# Patient Record
Sex: Male | Born: 2000 | Race: Black or African American | Hispanic: No | Marital: Single | Smoking: Never smoker
Health system: Southern US, Community
[De-identification: ages and names within clinical notes are randomized; demographics above are authoritative.]

## PROBLEM LIST (undated history)

## (undated) ENCOUNTER — Emergency Department (HOSPITAL_BASED_OUTPATIENT_CLINIC_OR_DEPARTMENT_OTHER): Admission: EM | Payer: 59 | Source: Home / Self Care

## (undated) DIAGNOSIS — F411 Generalized anxiety disorder: Secondary | ICD-10-CM

## (undated) DIAGNOSIS — F339 Major depressive disorder, recurrent, unspecified: Secondary | ICD-10-CM

## (undated) DIAGNOSIS — J45909 Unspecified asthma, uncomplicated: Secondary | ICD-10-CM

## (undated) HISTORY — DX: Major depressive disorder, recurrent, unspecified: F33.9

## (undated) HISTORY — DX: Generalized anxiety disorder: F41.1

---
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
## 2022-01-01 DIAGNOSIS — F332 Major depressive disorder, recurrent severe without psychotic features: Secondary | ICD-10-CM

## 2022-01-01 DIAGNOSIS — R45851 Suicidal ideations: Secondary | ICD-10-CM

## 2022-01-01 HISTORY — DX: Major depressive disorder, recurrent severe without psychotic features: F33.2

## 2022-01-01 LAB — RAPID URINE DRUG SCREEN, HOSP PERFORMED
Amphetamines: NOT DETECTED
Barbiturates: NOT DETECTED
Benzodiazepines: NOT DETECTED
Cocaine: NOT DETECTED
Opiates: NOT DETECTED
Tetrahydrocannabinol: NOT DETECTED

## 2022-01-01 LAB — RESP PANEL BY RT-PCR (FLU A&B, COVID) ARPGX2
Influenza A by PCR: NEGATIVE
Influenza B by PCR: NEGATIVE
SARS Coronavirus 2 by RT PCR: NEGATIVE

## 2022-01-01 MED ORDER — HYDROXYZINE HCL 50 MG PO TABS
50.0000 mg | ORAL_TABLET | Freq: Every evening | ORAL | Status: DC | PRN
  Administered 2022-01-01: 50 mg via ORAL
  Filled 2022-01-01: qty 1

## 2022-01-01 MED ORDER — ARIPIPRAZOLE 2 MG PO TABS
2.0000 mg | ORAL_TABLET | Freq: Every day | ORAL | Status: DC
  Administered 2022-01-01 – 2022-01-02 (×2): 2 mg via ORAL
  Filled 2022-01-01 (×3): qty 1

## 2022-01-01 MED ORDER — VENLAFAXINE HCL ER 75 MG PO CP24
75.0000 mg | ORAL_CAPSULE | Freq: Every day | ORAL | Status: DC
  Administered 2022-01-02: 75 mg via ORAL
  Filled 2022-01-01: qty 1

## 2022-01-01 NOTE — Progress Notes (Signed)
Per [name], NP, patient meets criteria for inpatient treatment. There are no available beds at [facility] today. CSW faxed referrals to the following facilities for review:  [facility]  Pending - No Request Sent N/A [address], [place] [address] [phone] --  [facility]  Pending - No Request Sent N/A [address] [place] [address] [phone] --  [facility]  Pending - No Request Sent N/A [address], [place] [address] [phone] --  [facility]  Pending - No Request Sent N/A [address] [place] [address] [phone] --  [facility]  Pending - No Request Sent N/A [address] [place] [address] [phone] --  [facility]  Pending - No Request Sent N/A [address] [place] [address] [phone] --   TTS will continue to seek bed placement.  Crissie Reese, MSW, Lenice Pressman Phone: [phone] Disposition/TOC

## 2022-01-01 NOTE — ED Notes (Signed)
Belongings placed in locker # 7

## 2022-01-01 NOTE — ED Notes (Signed)
Visitor at bedside.

## 2022-01-01 NOTE — ED Notes (Signed)
Pt belongings located in locker #7: shirt, pants, shoes, key chain, phone charger x 2, headset. Valuables w/ security: cell phone, 1 $20 bill, debit card, wallet. Pt in scrubs upon arrival to [place]. Beaded bracelet removed from pt and placed in belongings bag. Sitter at bedside

## 2022-01-01 NOTE — Consult Note (Signed)
BH ED ASSESSMENT   Reason for Consult:  SI Referring Physician:  Lula Olszewski, [place] Patient Identification: Ian Li MRN:  [id number] ED Chief Complaint: Suicidal ideation  Diagnosis:  Principal Problem:   Suicidal ideation Active Problems:   Major depressive disorder, recurrent episode, severe (HCC)   ED Assessment Time Calculation: Start Time: 1200 Stop Time: 1230 Total Time in Minutes (Assessment Completion): 30   Subjective:   Ian Li is a 21 y.o. male patient who presented to [name], ED reporting increased depressive symptoms and suicidal ideations with plan.  He states 5 days ago his partner ended their 2-1/2-year relationship and he now wants to kill himself by shooting himself, wrecking his car, or overdosing on pills.  HPI:   Patient seen in his room at [facility] ED for face-to-face evaluation.  He is pleasant and cooperative with assessment.  He tells me he has a lot of current stressors going on.  He started college at [employer] and reports that is not going well.  He lives with 3 roommates and states he does not get along with them.  His partner of 2-1/2 years also broke up with him and he is experiencing a lot of grief.  He tells me has a history of depression and has attempted suicide 1 time before.  It was around 2-1/2 years ago where he attempted to overdose.  He did not go to the hospital and did not get psychiatric treatment after this attempt.  He tells me he told no one about this attempt.  Today he continues to endorse suicidal ideations however denies having a specific plan or intent.  He states he just feels very depressed and has no will to live at the moment.  He denies any homicidal ideations.  Denies any auditory or visual hallucinations.  He says his sleep and appetite has fluctuated.  His appetite more so recently has been decreased especially since the break-up.  He does see a psychiatrist for med management.  He is currently taking Pristiq, hydroxyzine, and  Abilify.  He reports many failed psychotropic trials with Zoloft, Lexapro, and Prozac.  He also sees a therapist once every 2 weeks.  He feels like his family is a good support system specifically his brother.  However he does feel like he does not have any friends right now.  Patient is unable to contract for safety at this time, he is agreeable to inpatient psychiatric treatment.  Patient is able to engage in logical and coherent conversation.  Speech is normal in rate and tone.  No evidence that he is responding to internal stimuli, does not appear psychotic at this time.  He does have depressed mood and affect.  He is not able to identify many reasons for living at this time.  He does not have anyone he wants me to contact at this time.  We will continue to recommend inpatient psychiatric treatment.  EDP, RN, and LCSW notified of disposition.  Past Psychiatric History:  Reported history of depression and anxiety.  1 previous suicide attempt around 2 years ago.  Risk to Self or Others: Is the patient at risk to self? Yes Has the patient been a risk to self in the past 6 months? No Has the patient been a risk to self within the distant past? Yes Is the patient a risk to others? No Has the patient been a risk to others in the past 6 months? No Has the patient been a risk to others within the  distant past? No  [place] Scale:  Flowsheet Row ED from 12/31/2021 in [facility] EMERGENCY DEPARTMENT  C-SSRS RISK CATEGORY Moderate Risk       Substance Abuse:  Alcohol / Drug Use Pain Medications: Denies abuse Prescriptions: Denies abuse Over the Counter: Denies abuse History of alcohol / drug use?: No history of alcohol / drug abuse Longest period of sobriety (when/how long): NA  Past Medical History:  Past Medical History:  Diagnosis Date   Asthma    History reviewed. No pertinent surgical history. Family History: No family history on file.  Social History:  Social  History   Substance and Sexual Activity  Alcohol Use Never     Social History   Substance and Sexual Activity  Drug Use Never    Social History   Socioeconomic History   Marital status: Single    Spouse name: Not on file   Number of children: Not on file   Years of education: Not on file   Highest education level: Not on file  Occupational History   Not on file  Tobacco Use   Smoking status: Never   Smokeless tobacco: Never  Vaping Use   Vaping Use: Never used  Substance and Sexual Activity   Alcohol use: Never   Drug use: Never   Sexual activity: Not on file  Other Topics Concern   Not on file  Social History Narrative   Not on file   Social Determinants of Health   Financial Resource Strain: Not on file  Food Insecurity: Not on file  Transportation Needs: Not on file  Physical Activity: Not on file  Stress: Not on file  Social Connections: Not on file   Additional Social History:    Allergies:  No Known Allergies  Labs:  Results for orders placed or performed during the hospital encounter of 12/31/21 (from the past 48 hour(s))  Ethanol     Status: None   Collection Time: 12/31/21  8:30 PM  Result Value Ref Range   Alcohol, Ethyl (B) <10 <10 mg/dL    Comment: (NOTE) Lowest detectable limit for serum alcohol is 10 mg/dL.  For medical purposes only. Performed at [facility], [address], [place], [place] [address]   Acetaminophen level     Status: Abnormal   Collection Time: 12/31/21  8:30 PM  Result Value Ref Range   Acetaminophen (Tylenol), Serum <10 (L) 10 - 30 ug/mL    Comment: (NOTE) Therapeutic concentrations vary significantly. A range of 10-30 ug/mL  may be an effective concentration for many patients. However, some  are best treated at concentrations outside of this range. Acetaminophen concentrations >150 ug/mL at 4 hours after ingestion  and >50 ug/mL at 12 hours after ingestion are often associated with  toxic  reactions.  Performed at [facility], [address] [place], [place] [address]   Salicylate level     Status: Abnormal   Collection Time: 12/31/21  8:30 PM  Result Value Ref Range   Salicylate Lvl <7.0 (L) 7.0 - 30.0 mg/dL    Comment: Performed at [facility], [address], [place], [place] [address]  CBC with Differential/Platelet     Status: None   Collection Time: 12/31/21  8:30 PM  Result Value Ref Range   WBC 8.5 4.0 - 10.5 K/uL   RBC 4.69 4.22 - 5.81 MIL/uL   Hemoglobin 13.7 13.0 - 17.0 g/dL   HCT 76.5 46.5 - 03.5 %  MCV 89.3 80.0 - 100.0 fL   MCH 29.2 26.0 - 34.0 pg   MCHC 32.7 30.0 - 36.0 g/dL   RDW 29.9 37.1 - 69.6 %   Platelets 300 150 - 400 K/uL   nRBC 0.0 0.0 - 0.2 %   Neutrophils Relative % 61 %   Neutro Abs 5.2 1.7 - 7.7 K/uL   Lymphocytes Relative 29 %   Lymphs Abs 2.5 0.7 - 4.0 K/uL   Monocytes Relative 8 %   Monocytes Absolute 0.7 0.1 - 1.0 K/uL   Eosinophils Relative 2 %   Eosinophils Absolute 0.1 0.0 - 0.5 K/uL   Basophils Relative 0 %   Basophils Absolute 0.0 0.0 - 0.1 K/uL   Immature Granulocytes 0 %   Abs Immature Granulocytes 0.02 0.00 - 0.07 K/uL    Comment: Performed at [facility], [address] [place], [place] [address]  Comprehensive metabolic panel     Status: None   Collection Time: 12/31/21  8:30 PM  Result Value Ref Range   Sodium 142 135 - 145 mmol/L   Potassium 3.8 3.5 - 5.1 mmol/L   Chloride 105 98 - 111 mmol/L   CO2 25 22 - 32 mmol/L   Glucose, Bld 90 70 - 99 mg/dL    Comment: Glucose reference range applies only to samples taken after fasting for at least 8 hours.   BUN 8 6 - 20 mg/dL   Creatinine, Ser 1.01 0.61 - 1.24 mg/dL   Calcium 9.7 8.9 - 75.1 mg/dL   Total Protein 7.0 6.5 - 8.1 g/dL   Albumin 4.2 3.5 - 5.0 g/dL   AST 19 15 - 41 U/L   ALT 14 0 - 44 U/L   Alkaline Phosphatase 72 38 - 126 U/L   Total Bilirubin 0.4 0.3 - 1.2 mg/dL   GFR, Estimated >02 >58 mL/min    Comment: (NOTE) Calculated  using the CKD-EPI Creatinine Equation (2021)    Anion gap 12 5 - 15    Comment: Performed at [facility], [address], [place], [place] [address]  Resp Panel by RT-PCR (Flu A&B, Covid) Anterior Nasal Swab     Status: None   Collection Time: 01/01/22  4:21 AM   Specimen: Anterior Nasal Swab  Result Value Ref Range   SARS Coronavirus 2 by RT PCR NEGATIVE NEGATIVE    Comment: (NOTE) SARS-CoV-2 target nucleic acids are NOT DETECTED.  The SARS-CoV-2 RNA is generally detectable in upper respiratory specimens during the acute phase of infection. The lowest concentration of SARS-CoV-2 viral copies this assay can detect is 138 copies/mL. A negative result does not preclude SARS-Cov-2 infection and should not be used as the sole basis for treatment or other patient management decisions. A negative result may occur with  improper specimen collection/handling, submission of specimen other than nasopharyngeal swab, presence of viral mutation(s) within the areas targeted by this assay, and inadequate number of viral copies(<138 copies/mL). A negative result must be combined with clinical observations, patient history, and epidemiological information. The expected result is Negative.  Fact Sheet for Patients:  BloggerCourse.com  Fact Sheet for Healthcare Providers:  SeriousBroker.it  This test is no t yet approved or cleared by the [place] FDA and  has been authorized for detection and/or diagnosis of SARS-CoV-2 by FDA under an Emergency Use Authorization (EUA). This EUA will remain  in effect (meaning this test can be used) for the duration of the COVID-19 declaration under Section 564(b)(1) of  the Act, 21 U.S.C.section 360bbb-3(b)(1), unless the authorization is terminated  or revoked sooner.       Influenza A by PCR NEGATIVE NEGATIVE   Influenza B by PCR NEGATIVE NEGATIVE    Comment: (NOTE) The Xpert Xpress  SARS-CoV-2/FLU/RSV plus assay is intended as an aid in the diagnosis of influenza from Nasopharyngeal swab specimens and should not be used as a sole basis for treatment. Nasal washings and aspirates are unacceptable for Xpert Xpress SARS-CoV-2/FLU/RSV testing.  Fact Sheet for Patients: BloggerCourse.comhttps://www.fda.gov/media/152166/download  Fact Sheet for Healthcare Providers: SeriousBroker.ithttps://www.fda.gov/media/152162/download  This test is not yet approved or cleared by the [place] FDA and has been authorized for detection and/or diagnosis of SARS-CoV-2 by FDA under an Emergency Use Authorization (EUA). This EUA will remain in effect (meaning this test can be used) for the duration of the COVID-19 declaration under Section 564(b)(1) of the Act, 21 U.S.C. section 360bbb-3(b)(1), unless the authorization is terminated or revoked.  Performed at [facility], [address] [place], [place] [address]     No current facility-administered medications for this encounter.   Current Outpatient Medications  Medication Sig Dispense Refill   ARIPiprazole (ABILIFY) 2 MG tablet Take 2 mg by mouth daily.     desvenlafaxine (PRISTIQ) 50 MG 24 hr tablet Take 50 mg by mouth daily.     hydrOXYzine (ATARAX) 50 MG tablet Take 50 mg by mouth at bedtime as needed.     naproxen (NAPROSYN) 500 MG tablet Take 500 mg by mouth 2 (two) times daily.     ibuprofen (ADVIL) 800 MG tablet Take 1 tablet (800 mg total) by mouth every 6 (six) hours as needed for moderate pain. (Patient not taking: Reported on 12/31/2021) 20 tablet 0   Psychiatric Specialty Exam: Presentation  General Appearance: Appropriate for Environment  Eye Contact:Good  Speech:Clear and Coherent  Speech Volume:Normal  Handedness:No data recorded  Mood and Affect  Mood:Depressed  Affect:Congruent   Thought Process  Thought Processes:Coherent  Descriptions of Associations:Intact  Orientation:Full (Time, Place and Person)  Thought  Content:Logical  History of Schizophrenia/Schizoaffective disorder:No  Duration of Psychotic Symptoms:No data recorded Hallucinations:Hallucinations: None  Ideas of Reference:None  Suicidal Thoughts:Suicidal Thoughts: Yes, Passive SI Passive Intent and/or Plan: Without Intent; Without Plan  Homicidal Thoughts:Homicidal Thoughts: No   Sensorium  Memory:Immediate Good; Recent Good  Judgment:Fair  Insight:Fair   Executive Functions  Concentration:Good  Attention Span:Good  Recall:Good  Fund of Knowledge:Good  Language:Good   Psychomotor Activity  Psychomotor Activity:Psychomotor Activity: Normal   Assets  Assets:Communication Skills; Physical Health; Resilience; Desire for Improvement; Social Support    Sleep  Sleep:Sleep: Fair   Physical Exam: Physical Exam Neurological:     Mental Status: He is alert and oriented to person, place, and time.    Review of Systems  Psychiatric/Behavioral:  Positive for depression and suicidal ideas. The patient has insomnia.   All other systems reviewed and are negative.  Blood pressure 118/77, pulse (!) 59, temperature 98.2 F (36.8 C), temperature source Oral, resp. rate 14, height 6' (1.829 m), weight 99.8 kg, SpO2 99 %. Body mass index is 29.84 kg/m.  Medical Decision Making: Patient case reviewed and discussed with Dr. Gasper Sellsinderella.  We will continue to recommend inpatient psychiatric treatment.  Patient continues to endorse SI, is not able to contract for safety.  [facility], CSW notified and patient will be faxed out.  EDP, RN, and LCSW notified of disposition.  -Continue home medications  Disposition:  Recommend psychiatric Inpatient admission when medically cleared.  Eligha Bridegroom, NP 01/01/2022 1:19 PM

## 2022-01-01 NOTE — ED Provider Notes (Addendum)
Emergency Medicine Observation Re-evaluation Note  Ian Li is a 21 y.o. male, seen on rounds today.  Pt initially presented to the ED for complaints of feels unsafe Currently, the patient is alert, watching TV.  Physical Exam  BP 118/77 (BP Location: Right Arm)   Pulse (!) 59   Temp 98.2 F (36.8 C) (Oral)   Resp 14   Ht 6' (1.829 m)   Wt 99.8 kg   SpO2 99%   BMI 29.84 kg/m  Physical Exam General: NAD Cardiac: Well perfused Lungs: even and unlabored Psych: No agitation  ED Course / MDM  EKG:EKG Interpretation  Date/Time:  Saturday December 31 2021 20:44:28 EDT Ventricular Rate:  64 PR Interval:  138 QRS Duration: 100 QT Interval:  392 QTC Calculation: 404 R Axis:   69 Text Interpretation: Normal sinus rhythm with sinus arrhythmia Normal ECG Confirmed by [name] ([id number]) on 01/01/2022 12:14:39 AM  I have reviewed the labs performed to date as well as medications administered while in observation.  Recent changes in the last 24 hours include evaluated by psychiatry, meets criteria for inpatient treatment.  Plan  Current plan is for Inpatient, social work coordinating.  Update: Psychiatry recs commended that the patient be IVC it as he is no longer willing to stay voluntarily.  He had endorsed suicidal ideation earlier today and has a history of suicide attempts.  IVC paperwork and first exam filled out and faxed. Home meds ordered.    Ernie Avena, MD 01/01/22 [id number]    Ernie Avena, MD 01/01/22 [id number]    Ernie Avena, MD 01/01/22 [id number]

## 2022-01-01 NOTE — ED Notes (Signed)
Patient is making phone call at RN station

## 2022-01-01 NOTE — ED Notes (Signed)
Pt resting with eyes closed; respirations spontaneous, even, unlabored 
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
## 2022-01-01 NOTE — ED Notes (Signed)
Pt's mother updated on pt status and about visiting hours.

## 2022-01-01 NOTE — ED Notes (Signed)
TTS in progress 

## 2022-01-01 NOTE — ED Notes (Signed)
Mother called, informed her that the doctors had recommended inpatient treatment.

## 2022-01-02 ENCOUNTER — Encounter (HOSPITAL_COMMUNITY): Payer: Self-pay | Admitting: Nurse Practitioner

## 2022-01-02 ENCOUNTER — Inpatient Hospital Stay (HOSPITAL_COMMUNITY)
Admission: AD | Admit: 2022-01-02 | Discharge: 2022-01-07 | DRG: 885 | Disposition: A | Discharge status: Home / Self Care | Payer: 59 | Source: Intra-hospital | Attending: Emergency Medicine | Admitting: Emergency Medicine

## 2022-01-02 ENCOUNTER — Other Ambulatory Visit: Payer: Self-pay

## 2022-01-02 DIAGNOSIS — R45851 Suicidal ideations: Secondary | ICD-10-CM | POA: Diagnosis present

## 2022-01-02 DIAGNOSIS — Z9151 Personal history of suicidal behavior: Secondary | ICD-10-CM

## 2022-01-02 DIAGNOSIS — Z91410 Personal history of adult physical and sexual abuse: Secondary | ICD-10-CM

## 2022-01-02 DIAGNOSIS — E781 Pure hyperglyceridemia: Secondary | ICD-10-CM | POA: Diagnosis present

## 2022-01-02 DIAGNOSIS — Z20822 Contact with and (suspected) exposure to covid-19: Secondary | ICD-10-CM | POA: Diagnosis present

## 2022-01-02 DIAGNOSIS — F419 Anxiety disorder, unspecified: Secondary | ICD-10-CM

## 2022-01-02 DIAGNOSIS — R634 Abnormal weight loss: Secondary | ICD-10-CM | POA: Diagnosis present

## 2022-01-02 DIAGNOSIS — F332 Major depressive disorder, recurrent severe without psychotic features: Principal | ICD-10-CM | POA: Diagnosis present

## 2022-01-02 DIAGNOSIS — Z818 Family history of other mental and behavioral disorders: Secondary | ICD-10-CM

## 2022-01-02 DIAGNOSIS — F4310 Post-traumatic stress disorder, unspecified: Secondary | ICD-10-CM | POA: Diagnosis present

## 2022-01-02 DIAGNOSIS — F329 Major depressive disorder, single episode, unspecified: Secondary | ICD-10-CM | POA: Diagnosis present

## 2022-01-02 DIAGNOSIS — G4700 Insomnia, unspecified: Secondary | ICD-10-CM | POA: Diagnosis present

## 2022-01-02 DIAGNOSIS — J45909 Unspecified asthma, uncomplicated: Secondary | ICD-10-CM | POA: Diagnosis present

## 2022-01-02 MED ORDER — ACETAMINOPHEN 325 MG PO TABS
650.0000 mg | ORAL_TABLET | Freq: Four times a day (QID) | ORAL | Status: DC | PRN
  Administered 2022-01-05: 650 mg via ORAL
  Filled 2022-01-02: qty 2

## 2022-01-02 MED ORDER — VENLAFAXINE HCL ER 75 MG PO CP24
75.0000 mg | ORAL_CAPSULE | Freq: Every day | ORAL | Status: DC
Start: 2022-01-03 — End: 2022-01-03
  Administered 2022-01-03: 75 mg via ORAL
  Filled 2022-01-02 (×3): qty 1

## 2022-01-02 MED ORDER — ALUM & MAG HYDROXIDE-SIMETH 200-200-20 MG/5ML PO SUSP: 30.0000 mL | ORAL | Status: DC | PRN

## 2022-01-02 MED ORDER — ARIPIPRAZOLE 2 MG PO TABS
2.0000 mg | ORAL_TABLET | Freq: Every day | ORAL | Status: DC
Start: 2022-01-03 — End: 2022-01-03
  Administered 2022-01-03: 2 mg via ORAL
  Filled 2022-01-02 (×3): qty 1

## 2022-01-02 MED ORDER — ENSURE ENLIVE PO LIQD
237.0000 mL | Freq: Two times a day (BID) | ORAL | Status: DC
  Administered 2022-01-03: 237 mL via ORAL
  Filled 2022-01-02 (×11): qty 237

## 2022-01-02 MED ORDER — MAGNESIUM HYDROXIDE 400 MG/5ML PO SUSP: 30.0000 mL | Freq: Every day | ORAL | Status: DC | PRN

## 2022-01-02 MED ORDER — HYDROXYZINE HCL 50 MG PO TABS
50.0000 mg | ORAL_TABLET | Freq: Every evening | ORAL | Status: DC | PRN
  Administered 2022-01-02: 50 mg via ORAL
  Filled 2022-01-02: qty 1

## 2022-01-02 NOTE — ED Notes (Signed)
Mother provided with hand out on visiting hours.

## 2022-01-02 NOTE — ED Notes (Signed)
Pt's Mother requested to bring out side food to PT . Mother informed Pt unable to receive out side food .

## 2022-01-02 NOTE — ED Notes (Signed)
PT Mother Okey Regal at bed side and requested Pt belongings to take home. Mother signed forms from belongings including items locked in SAFE.

## 2022-01-02 NOTE — Progress Notes (Signed)
Patient is an 21 year old male who presented under IVC from [facility] for increased depression and SI. Pt reported his stressors as school, and a recent break up with a girlfriend. Pt denied history of alcohol or substance use. Pt currently denies SI/HI and A/VH.  Patient presented friendly, polite, was calm and cooperative and answered questions logically and coherently during admission interview and assessment. VS monitored and recorded. Skin check performed with MHT. Patient did not have any belongings, other than a paperback book  Admission paperwork completed and signed. Verbal understanding expressed. Pt was oriented to unit and schedule. Q 15 min checks initiated for safety.Marland Kitchen

## 2022-01-02 NOTE — ED Notes (Signed)
Pt mother returned for 1230 visit. Mother updated on Pt admission to [facility] after 1600 today.Mother given the address and telephone # to [place]

## 2022-01-02 NOTE — ED Provider Notes (Addendum)
Emergency Medicine Observation Re-evaluation Note  Ian Li is a 21 y.o. male, seen on rounds today.  Pt initially presented to the ED for complaints of Suicidal (/) Currently, the patient is sleeping.  Physical Exam  BP (!) 88/60 (BP Location: Right Arm)   Pulse 60   Temp 98.4 F (36.9 C) (Oral)   Resp 18   Ht 1.829 m (6')   Wt 99.8 kg   SpO2 96%   BMI 29.84 kg/m  Physical Exam General: Resting Cardiac: Regular rate Lungs: Normal respiratory effort Psych: Deferred  ED Course / MDM  EKG:EKG Interpretation  Date/Time:  Saturday December 31 2021 20:44:28 EDT Ventricular Rate:  64 PR Interval:  138 QRS Duration: 100 QT Interval:  392 QTC Calculation: 404 R Axis:   69 Text Interpretation: Normal sinus rhythm with sinus arrhythmia Normal ECG Confirmed by [name] ([id number]) on 01/01/2022 12:14:39 AM  I have reviewed the labs performed to date as well as medications administered while in observation.  Recent changes in the last 24 hours include evaluation by psychiatry yesterday.  Recommendation is for inpatient treatment.  Initial blood pressure this morning decreased at 88/60.  Repeat blood pressure is 121/71.  Doubt first BP was accurate  Plan  Current plan is for inpatient psychiatric treatment.    Linwood Dibbles, MD 01/02/22 [phone] Plan is to transfer to behavioral health.  Dr. Mason Jim is the accepting physician   Linwood Dibbles, MD 01/02/22 1137

## 2022-01-02 NOTE — Progress Notes (Signed)
Pt was accepted to [facility] 01/02/22; Bed Assignment 306-1 at 1600  Dx: MDD.   Pt meets inpatient criteria per Eligha Bridegroom, NP  Attending Physician will be Mason Jim, MD  Report can be called to: - Child and Adolescence unit: [phone] -Adult unit: [phone]  Pt can arrive after 1600  Please fax IVC paperwork to [phone].   Care Team notified: [facility] Rona Ravens, RN, Eligha Bridegroom, NP, Celine Ahr, RN, Iantha Fallen, MD, [address] [place], LCSWA 01/02/2022 @ 11:44 AM

## 2022-01-02 NOTE — Progress Notes (Signed)
Patient did attend the evening speaker AA meeting.Pt introduced himself as a non-alcoholic joining the meeting. Pt was presented anxious and tearful during meeting but was supportive.

## 2022-01-02 NOTE — ED Notes (Signed)
Mother informed Pt will need to call Mother when he is transferred to inpatient.

## 2022-01-02 NOTE — ED Notes (Signed)
IVC paper work faxed to [phone]

## 2022-01-02 NOTE — Tx Team (Signed)
Initial Treatment Plan 01/02/2022 8:48 PM Murdock Jellison VOP:[id number]    PATIENT STRESSORS: Educational concerns   Loss of relationship     PATIENT STRENGTHS: Ability for insight  Average or above average intelligence  Capable of independent living  Communication skills  Motivation for treatment/growth  Physical Health  Supportive family/friends    PATIENT IDENTIFIED PROBLEMS: Suicidal ideation    Increased depression    Worrying             DISCHARGE CRITERIA:  Improved stabilization in mood, thinking, and/or behavior Need for constant or close observation no longer present Reduction of life-threatening or endangering symptoms to within safe limits Verbal commitment to aftercare and medication compliance  PRELIMINARY DISCHARGE PLAN: Outpatient therapy Return to previous work or school arrangements  PATIENT/FAMILY INVOLVEMENT: This treatment plan has been presented to and reviewed with the patient, Ian Li,  The patient has been given the opportunity to ask questions and make suggestions.  Shela Nevin, RN 01/02/2022, 8:48 PM

## 2022-01-03 DIAGNOSIS — F419 Anxiety disorder, unspecified: Secondary | ICD-10-CM

## 2022-01-03 HISTORY — DX: Anxiety disorder, unspecified: F41.9

## 2022-01-03 LAB — HEMOGLOBIN A1C
Hgb A1c MFr Bld: 5.4 % (ref 4.8–5.6)
Mean Plasma Glucose: 108.28 mg/dL

## 2022-01-03 LAB — TSH: TSH: 0.514 u[IU]/mL (ref 0.350–4.500)

## 2022-01-03 MED ORDER — BUPROPION HCL ER (XL) 150 MG PO TB24
150.0000 mg | ORAL_TABLET | Freq: Every day | ORAL | Status: DC
  Administered 2022-01-04 – 2022-01-05 (×2): 150 mg via ORAL
  Filled 2022-01-03 (×3): qty 1

## 2022-01-03 MED ORDER — ARIPIPRAZOLE 5 MG PO TABS
5.0000 mg | ORAL_TABLET | Freq: Every day | ORAL | Status: DC
  Administered 2022-01-04 – 2022-01-07 (×4): 5 mg via ORAL
  Filled 2022-01-03 (×5): qty 1

## 2022-01-03 MED ORDER — HYDROXYZINE HCL 25 MG PO TABS
25.0000 mg | ORAL_TABLET | Freq: Three times a day (TID) | ORAL | Status: DC | PRN
  Administered 2022-01-04 – 2022-01-06 (×3): 25 mg via ORAL
  Filled 2022-01-03 (×3): qty 1

## 2022-01-03 MED ORDER — TRAZODONE HCL 50 MG PO TABS
50.0000 mg | ORAL_TABLET | Freq: Every evening | ORAL | Status: DC | PRN
Start: 2022-01-03 — End: 2022-01-07
  Administered 2022-01-03 – 2022-01-06 (×4): 50 mg via ORAL
  Filled 2022-01-03 (×4): qty 1

## 2022-01-03 NOTE — Progress Notes (Signed)
   01/02/22 2055  Psych Admission Type (Psych Patients Only)  Admission Status Involuntary  Psychosocial Assessment  Patient Complaints None  Eye Contact Brief  Facial Expression Anxious  Affect Appropriate to circumstance  Speech Logical/coherent  Interaction Assertive  Motor Activity Slow  Appearance/Hygiene In scrubs  Behavior Characteristics Cooperative;Appropriate to situation  Mood Anxious  Thought Process  Coherency WDL  Content WDL  Delusions None reported or observed  Perception WDL  Hallucination None reported or observed  Judgment Poor  Confusion None  Danger to Self  Current suicidal ideation? Denies  Agreement Not to Harm Self Yes  Description of Agreement verbal  Danger to Others  Danger to Others None reported or observed   Progress note   D: Pt seen at nurse's station. Pt denies SI, HI, AVH. Pt rates pain  0/10. Pt rates anxiety  0/10 and depression  0/10. Pt states that he has had outpatient behavioral health but not inpatient. Pt questions answered about unit milieu. Discussed pt medications. Pt states that he doesn't want the nutritional supplements ordered because he is weight conscious. Says he hadn't eaten for about a week but is not starting to eat more. Pt started on Effexor in ED before coming to [facility]. Pt denies any side effects. No other concerns noted at this time.  A: Pt provided support and encouragement. Pt given scheduled medication as prescribed. PRNs as appropriate. Q15 min checks for safety.   R: Pt safe on the unit. Will continue to monitor.
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
## 2022-01-03 NOTE — Progress Notes (Signed)
The patient's positive event for the day is that he had a good visit with his brother. He also talked to a friend that he made on the unit. He rates his day as a 7 out of 10 since today was his first day in the hospital.

## 2022-01-03 NOTE — Progress Notes (Signed)
   01/03/22 0800  Psych Admission Type (Psych Patients Only)  Admission Status Involuntary  Psychosocial Assessment  Patient Complaints None  Eye Contact Brief  Facial Expression Anxious  Affect Appropriate to circumstance  Speech Logical/coherent  Interaction Cautious  Motor Activity Slow  Appearance/Hygiene In scrubs  Behavior Characteristics Cooperative;Appropriate to situation  Mood Anxious  Thought Process  Coherency WDL  Content WDL  Delusions None reported or observed  Perception WDL  Hallucination None reported or observed  Judgment Poor  Confusion None  Danger to Self  Current suicidal ideation? Denies  Agreement Not to Harm Self Yes  Description of Agreement Verbal  Danger to Others  Danger to Others None reported or observed

## 2022-01-03 NOTE — Group Note (Signed)
Recreation Therapy Group Note   Group Topic:Animal Assisted Therapy   Group Date: 01/03/2022 Start Time: 1430 End Time: 1515 Facilitators: Caroll Rancher, LRT,CTRS Location: [address] Dayroom   Animal-Assisted Activity (AAA) Program Checklist/Progress Note Patient Eligibility Criteria Checklist & Daily Group note for Rec Tx Intervention   AAA/T Program Assumption of Risk Form signed by Patient/ or Parent Legal Guardian YES  Patient understands their participation is voluntary YES   Affect/Mood: N/A   Participation Level: Did not attend    Clinical Observations/Individualized Feedback:     Plan: Continue to engage patient in RT group sessions 2-3x/week.   Caroll Rancher, LRT,CTRS 01/03/2022 3:54 PM

## 2022-01-03 NOTE — [facility] Group Notes (Signed)
Adult Psychoeducational Group Note  Date:  01/03/2022 Time:  9:56 AM  Group Topic/Focus:  Goals Group:   The focus of this group is to help patients establish daily goals to achieve during treatment and discuss how the patient can incorporate goal setting into their daily lives to aide in recovery.  Participation Level:  Did Not Attend  Elmond Poehlman R Hannie Shoe 01/03/2022, 9:56 AM 
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
## 2022-01-03 NOTE — Hospital Course (Addendum)
Ian Li is a 21 y.o. male patient who identifies as gender-fluid with a past psychiatric history of MDD, 2 prior suicide attempts, unspecified anxiety, and PTSD who was  transferred from [name] ED involuntarily for worsening depressive symptoms for past 5 days and SI with plan triggered by a recent breakup with girlfriend in the setting of multiple longstanding stressors (see below), and failed trials on SSRIs/SNRIs.  Pristiq > Effexor  24-hour events: - 3 groups - took scheduled meds - trazodone, hydroxyzine PRN meds  Relevant labs / tests / procedures: - lipid panel high trigs - TSH 0.514 normal - A1c 5.4 normal  Plan today (01/03/2022) - venlafaxine XR (Effexor XR) discontinue  - START bupropion (Wellbutrin XL) 75 tomorrow, then 150 next day - aripiprazole from 2 mg up to 5 mg  (max 15 per day)  - TSH, A1c, lipid panel  Plan Yesterday ***  Dispo Plan: - Sunday (9/9) or Monday (9/10)

## 2022-01-04 ENCOUNTER — Encounter (HOSPITAL_COMMUNITY): Payer: Self-pay

## 2022-01-04 LAB — LIPID PANEL
Cholesterol: 165 mg/dL (ref 0–169)
HDL: 35 mg/dL — ABNORMAL LOW (ref >40)
LDL Cholesterol: 78 mg/dL (ref 0–99)
Total CHOL/HDL Ratio: 4.7 RATIO
Triglycerides: 260 mg/dL — ABNORMAL HIGH (ref <150)
VLDL: 52 mg/dL — ABNORMAL HIGH (ref 0–40)

## 2022-01-04 NOTE — Progress Notes (Signed)
   01/04/22 2100  Psych Admission Type (Psych Patients Only)  Admission Status Involuntary  Psychosocial Assessment  Patient Complaints None  Eye Contact Fair  Facial Expression Anxious  Affect Appropriate to circumstance  Speech Logical/coherent  Interaction Assertive  Motor Activity Other (Comment) (wnl)  Appearance/Hygiene In scrubs  Behavior Characteristics Cooperative;Appropriate to situation  Mood Pleasant  Thought Process  Coherency WDL  Content WDL  Delusions None reported or observed  Perception WDL  Hallucination None reported or observed  Judgment WDL  Confusion None  Danger to Self  Current suicidal ideation? Denies  Danger to Others  Danger to Others None reported or observed   Progress note   D: Pt seen at med window. Pt denies SI, HI, AVH. Pt rates pain  0/10. Pt rates anxiety  0/10 and depression  0/10. Pt says that he has not been suicidal since he first came to the hospital. "They have it wrong. I never had a plan. When I had a plan, it was earlier in the year. I try to tell them that I am better and am not suicidal and I don't think they're listening." Pt encouraged to ask provider about discharge. Pt endorses group attendance. No other complaints noted.  A: Pt provided support and encouragement. Pt given scheduled medication as prescribed. PRNs as appropriate. Q15 min checks for safety.   R: Pt safe on the unit. Will continue to monitor.

## 2022-01-04 NOTE — Progress Notes (Signed)
The patient attended the evening N.A.meeting and was appropriate.  

## 2022-01-04 NOTE — Progress Notes (Signed)
   01/04/22 0925  Psych Admission Type (Psych Patients Only)  Admission Status Involuntary  Psychosocial Assessment  Patient Complaints Anxiety;Depression  Eye Contact Brief  Facial Expression Anxious  Affect Depressed  Speech Logical/coherent  Interaction Guarded  Motor Activity Other (Comment) (wdl)  Appearance/Hygiene In scrubs  Behavior Characteristics Cooperative  Mood Depressed  Thought Process  Coherency WDL  Content WDL  Delusions None reported or observed  Perception WDL  Hallucination None reported or observed  Judgment Poor  Confusion None  Danger to Self  Current suicidal ideation? Denies  Agreement Not to Harm Self Yes  Description of Agreement verbal contract  Danger to Others  Danger to Others None reported or observed

## 2022-01-04 NOTE — BH IP Treatment Plan (Signed)
Interdisciplinary Treatment and Diagnostic Plan Update  01/04/2022 Time of Session: 0830 Ian Li MRN: [id number]  Principal Diagnosis: Major depressive disorder, recurrent episode, severe (HCC)  Secondary Diagnoses: Principal Problem:   Major depressive disorder, recurrent episode, severe (HCC) Active Problems:   Anxiety disorder, unspecified   Current Medications:  Current Facility-Administered Medications  Medication Dose Route Frequency Provider Last Rate Last Admin   acetaminophen (TYLENOL) tablet 650 mg  650 mg Oral Q6H PRN Eligha Bridegroom, NP       alum & mag hydroxide-simeth (MAALOX/MYLANTA) 200-200-20 MG/5ML suspension 30 mL  30 mL Oral Q4H PRN Eligha Bridegroom, NP       ARIPiprazole (ABILIFY) tablet 5 mg  5 mg Oral Daily Augusto Gamble, MD   5 mg at 01/04/22 4098   buPROPion (WELLBUTRIN XL) 24 hr tablet 150 mg  150 mg Oral Daily Augusto Gamble, MD   150 mg at 01/04/22 1191   feeding supplement (ENSURE ENLIVE / ENSURE PLUS) liquid 237 mL  237 mL Oral BID BM Mason Jim, Amy E, MD   237 mL at 01/03/22 1528   hydrOXYzine (ATARAX) tablet 25 mg  25 mg Oral TID PRN Augusto Gamble, MD       magnesium hydroxide (MILK OF MAGNESIA) suspension 30 mL  30 mL Oral Daily PRN Eligha Bridegroom, NP       traZODone (DESYREL) tablet 50 mg  50 mg Oral QHS PRN Augusto Gamble, MD   50 mg at 01/03/22 2118   PTA Medications: Medications Prior to Admission  Medication Sig Dispense Refill Last Dose   ARIPiprazole (ABILIFY) 2 MG tablet Take 2 mg by mouth daily.      desvenlafaxine (PRISTIQ) 50 MG 24 hr tablet Take 50 mg by mouth daily.      hydrOXYzine (ATARAX) 50 MG tablet Take 50 mg by mouth at bedtime as needed.      ibuprofen (ADVIL) 800 MG tablet Take 1 tablet (800 mg total) by mouth every 6 (six) hours as needed for moderate pain. (Patient not taking: Reported on 12/31/2021) 20 tablet 0    naproxen (NAPROSYN) 500 MG tablet Take 500 mg by mouth 2 (two) times daily.       Patient Stressors: Educational  concerns   Loss of relationship    Patient Strengths: Ability for insight  Average or above average intelligence  Capable of independent living  Printmaker for treatment/growth  Physical Health  Supportive family/friends   Treatment Modalities: Medication Management, Group therapy, Case management,  1 to 1 session with clinician, Psychoeducation, Recreational therapy.   Physician Treatment Plan for Primary Diagnosis: Major depressive disorder, recurrent episode, severe (HCC) Long Term Goal(s): Improvement in symptoms so as ready for discharge   Short Term Goals: Ability to verbalize feelings will improve Ability to disclose and discuss suicidal ideas Ability to identify and develop effective coping behaviors will improve Ability to identify triggers associated with substance abuse/mental health issues will improve  Medication Management: Evaluate patient's response, side effects, and tolerance of medication regimen.  Therapeutic Interventions: 1 to 1 sessions, Unit Group sessions and Medication administration.  Evaluation of Outcomes: Progressing  Physician Treatment Plan for Secondary Diagnosis: Principal Problem:   Major depressive disorder, recurrent episode, severe (HCC) Active Problems:   Anxiety disorder, unspecified  Long Term Goal(s): Improvement in symptoms so as ready for discharge   Short Term Goals: Ability to verbalize feelings will improve Ability to disclose and discuss suicidal ideas Ability to identify and develop effective coping behaviors will improve  Ability to identify triggers associated with substance abuse/mental health issues will improve     Medication Management: Evaluate patient's response, side effects, and tolerance of medication regimen.  Therapeutic Interventions: 1 to 1 sessions, Unit Group sessions and Medication administration.  Evaluation of Outcomes: Progressing   RN Treatment Plan for Primary Diagnosis: Major  depressive disorder, recurrent episode, severe (HCC) Long Term Goal(s): Knowledge of disease and therapeutic regimen to maintain health will improve  Short Term Goals: Ability to remain free from injury will improve, Ability to verbalize frustration and anger appropriately will improve, Ability to demonstrate self-control, Ability to participate in decision making will improve, Ability to verbalize feelings will improve, Ability to disclose and discuss suicidal ideas, Ability to identify and develop effective coping behaviors will improve, and Compliance with prescribed medications will improve  Medication Management: RN will administer medications as ordered by provider, will assess and evaluate patient's response and provide education to patient for prescribed medication. RN will report any adverse and/or side effects to prescribing provider.  Therapeutic Interventions: 1 on 1 counseling sessions, Psychoeducation, Medication administration, Evaluate responses to treatment, Monitor vital signs and CBGs as ordered, Perform/monitor CIWA, COWS, AIMS and Fall Risk screenings as ordered, Perform wound care treatments as ordered.  Evaluation of Outcomes: Progressing   LCSW Treatment Plan for Primary Diagnosis: Major depressive disorder, recurrent episode, severe (HCC) Long Term Goal(s): Safe transition to appropriate next level of care at discharge, Engage patient in therapeutic group addressing interpersonal concerns.  Short Term Goals: Engage patient in aftercare planning with referrals and resources, Increase social support, Increase ability to appropriately verbalize feelings, Increase emotional regulation, Facilitate acceptance of mental health diagnosis and concerns, Facilitate patient progression through stages of change regarding substance use diagnoses and concerns, Identify triggers associated with mental health/substance abuse issues, and Increase skills for wellness and recovery  Therapeutic  Interventions: Assess for all discharge needs, 1 to 1 time with Social worker, Explore available resources and support systems, Assess for adequacy in community support network, Educate family and significant other(s) on suicide prevention, Complete Psychosocial Assessment, Interpersonal group therapy.  Evaluation of Outcomes: Progressing   Progress in Treatment: Attending groups: No. Participating in groups: No. Taking medication as prescribed: Yes. Toleration medication: Yes. Family/Significant other contact made: No, will contact:  CSW will obtain consent to reach family/friend.  Patient understands diagnosis: Yes. Discussing patient identified problems/goals with staff: Yes. Medical problems stabilized or resolved: Yes. Denies suicidal/homicidal ideation: No. Issues/concerns per patient self-inventory: Yes. Other: none  New problem(s) identified: No, Describe:  none   New Short Term/Long Term Goal(s): Patient to work towards medication management for mood stabilization; elimination of SI thoughts; development of comprehensive mental wellness plan.  Patient Goals: Patient states their goal for treatment is to "to get better enough to go home ."  Discharge Plan or Barriers: No psychosocial barriers identified at this time, patient to return to place of residence when appropriate for discharge.   Reason for Continuation of Hospitalization: Depression  Estimated Length of Stay: 1-7 days   Last 3 [place] Suicide Severity Risk Score: Flowsheet Row [facility] (Current[facility] from 12/31/2021 in [facility] EMERGENCY DEPART[facility]  C-SSRS RISK CATEGORY Moderate Risk Moderate Risk        Scribe for Treatment Team: Almedia Balls 01/04/2022 9:55 AM

## 2022-01-04 NOTE — Progress Notes (Signed)
   01/04/22 1700  Psych Admission Type (Psych Patients Only)  Admission Status Involuntary  Psychosocial Assessment  Patient Complaints Anxiety;Depression  Eye Contact Brief  Facial Expression Anxious  Affect Depressed  Speech Logical/coherent  Interaction Guarded  Motor Activity Other (Comment) (wdl)  Appearance/Hygiene In scrubs  Behavior Characteristics Cooperative  Mood Depressed  Thought Process  Coherency WDL  Content WDL  Delusions None reported or observed  Perception WDL  Hallucination None reported or observed  Judgment Poor  Confusion None  Danger to Self  Current suicidal ideation? Denies  Agreement Not to Harm Self Yes  Description of Agreement verbal contract  Danger to Others  Danger to Others None reported or observed

## 2022-01-04 NOTE — [facility] Suicide Risk Assessment (Signed)
[facility] INPATIENT:  Family/Significant Other Suicide Prevention Education  Suicide Prevention Education:  Education Completed; Lazar Tierce [phone] (Mother) has been identified by the patient as the family member/significant other with whom the patient will be residing, and identified as the person(s) who will aid the patient in the event of a mental health crisis (suicidal ideations/suicide attempt).  With written consent from the patient, the family member/significant other has been provided the following suicide prevention education, prior to the and/or following the discharge of the patient.  The suicide prevention education provided includes the following: Suicide risk factors Suicide prevention and interventions National Suicide Hotline telephone number [facility] assessment telephone number [facility] Emergency Assistance [address] [place] and/or Residential Mobile Crisis Unit telephone number  Request made of family/significant other to: Remove weapons (e.g., guns, rifles, knives), all items previously/currently identified as safety concern.   Remove drugs/medications (over-the-counter, prescriptions, illicit drugs), all items previously/currently identified as a safety concern.  The family member/significant other verbalizes understanding of the suicide prevention education information provided.  The family member/significant other agrees to remove the items of safety concern listed above.  CSW spoke with Mrs. Salvato who states that her son experienced a recent break-up and has some anxiety about being at school.  She states that her son was seeing a Clinical biochemist, as well as his regular therapist and psychiatrist.  She states that her son was taking medications for depression but stopped taking them 1 week before coming to the hospital.  Mrs. Radi states that after discharge she will be taking her son back to [employer] to his dorm room.  She states that  there are no firearms or weapons in the dorm room.  CSW completed SPE with Mrs. Knechtel.   Metro Kung Didi Ganaway 01/04/2022, 11:09 AM
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
## 2022-01-04 NOTE — Group Note (Signed)
Recreation Therapy Group Note   Group Topic:Health and Wellness  Group Date: 01/04/2022 Start Time: 1400 End Time: 1430 Facilitators: Caroll Rancher, [place] Location: [address]   Activity Description/Intervention: Therapeutic Drumming. Patients with peers and staff were given the opportunity to engage in a leader facilitated [facility] Group Empowerment Drumming Circle with staff from the [employer], in partnership with [employer]. Teaching laboratory technician and trained [employer], Theodoro Doing leading with LRT observing and documenting intervention and pt response. This evidenced-based practice targets 7 areas of health and wellbeing in the human experience including: stress-reduction, exercise, self-expression, camaraderie/support, nurturing, spirituality, and music-making.   Goal Area(s) Addresses:  Patient will engage in pro-social way in music group.  Patient will follow directions of drum leader on the first prompt. Patient will demonstrate no behavioral issues during group.  Patient will identify if a reduction in stress level occurs as a result of participation in therapeutic drum circle.    Affect/Mood: Appropriate   Participation Level: Engaged   Participation Quality: Independent   Behavior: Appropriate   Speech/Thought Process: Focused   Insight: Good   Judgement: Good   Modes of Intervention: Music   Patient Response to Interventions:  Engaged   Education Outcome:  Acknowledges education and In group clarification offered    Clinical Observations/Individualized Feedback:  Patient engaged actively in therapeutic drumming exercise and discussions. Pt was appropriate with peers, staff, and musical equipment for duration of programming.    Plan: Continue to engage patient in RT group sessions 2-3x/week.   Caroll Rancher, LRT,CTRS  01/04/2022 3:17 PM

## 2022-01-04 NOTE — Group Note (Signed)
  [facility] LCSW Group Therapy Note  Date/Time:  01/04/2022 [id number]  Type of Therapy and Topic:  Group Therapy:  Self-Care Wheel  Participation Level:  Active   Description of Group This process group involved patients discussing the importance of self-care in different areas of life (professional, personal, emotional, psychological, spiritual, and physical) in order to achieve healthy life balance.  The group talked about what self-care in each of those areas would constitute and then specifically listed how they want to provide themselves with improved self-care.  Therapeutic Goals Patient will learn how to break self-care down into various areas of life Patient will participate in generating ideas about healthy self-care options in each category Patients will be supportive of one another and receive support from others Patient will identify one healthy self-care activity to add to his/her life   Summary of Patient Progress:  The patient expressed that one thing he can share about him is that he enjoys spending time with his dog Maxx. Because others in group expressed similar ideas, this helped him to see how people are all connected in a variety of ways.  During the discussion about self-care, he shared that he enjoys being alone.  He/She was able to identify ways in which self-care in different areas would be helpful.  Patient's insight was appropriate.  Therapeutic Modalities Processing Psychoeducation   Jareth Pardee S. Darryll Raju, LCSW 01/04/2022 2:04 PM

## 2022-01-05 MED ORDER — BUPROPION HCL ER (XL) 300 MG PO TB24
300.0000 mg | ORAL_TABLET | Freq: Every day | ORAL | Status: DC
  Administered 2022-01-06 – 2022-01-07 (×2): 300 mg via ORAL
  Filled 2022-01-05 (×3): qty 1

## 2022-01-05 NOTE — Progress Notes (Signed)
Adult Psychoeducational Group Note  Date:  01/05/2022 Time:  9:32 PM  Group Topic/Focus:  Wrap-Up Group:   The focus of this group is to help patients review their daily goal of treatment and discuss progress on daily workbooks.  Participation Level:  Active  Participation Quality:  Appropriate  Affect:  Appropriate  Cognitive:  Appropriate  Insight: Appropriate  Engagement in Group:  Engaged  Modes of Intervention:  Discussion  Additional Comments:  Ian Li said his day was 7. His goal to talk more and yes he achieved his goal. His coping skills calling people. The one positive thing that happened he saw his sister today.  Ian Li 01/05/2022, 9:32 PM

## 2022-01-05 NOTE — Progress Notes (Signed)
Patient stated his feet were feeling better.

## 2022-01-05 NOTE — Plan of Care (Signed)
Spoke to patient's mom Guadlupe Spanish) with permission from the patient to speak to her only about medications  Patient's mom had concerns about increasing the Wellbutrin so quickly from 150-300 - felt like it was going up so fast so quickly.  I explained to the mom that we are able to go up quicker than usual as we are in an inpatient setting and we are able to monitor the patient for any adverse side effects.  Also explained to mom that the patient is only at a medium dose, and that the maximum dose is 450 mg.  Okey Regal thanked me for calling her and talking to her and explaining the scenario.  She expressed understanding of the need for the patient to go up on his Wellbutrin.

## 2022-01-05 NOTE — Progress Notes (Signed)
Patient's mother called and she talked to patient about wellbutrin.  Mother said she was taking wellbutrin also.

## 2022-01-05 NOTE — Progress Notes (Signed)
D: Pt presents with a pleasant mood. Pt denies SI/HI/AVH. Pt rates anxiety 0/10, depression 0/10, and pain 0/10. Pt reports that they slept about 7-8 hours last night. Pt states that their goal is " to participate more and to do better enough to be discharged".   A: RN provided scheduled medications as prescribed. Q 15 minute safety checks verified for safety. RN will continue to monitor pt's progress and provide assistance as indicated.   R: Pt is safe on the unit. Will continue to monitor patient.  01/05/22 0830  Psych Admission Type (Psych Patients Only)  Admission Status Involuntary  Psychosocial Assessment  Patient Complaints None  Eye Contact Fair  Facial Expression Anxious  Affect Appropriate to circumstance  Speech Logical/coherent  Interaction Assertive  Motor Activity Other (Comment) (WDL)  Appearance/Hygiene In scrubs  Behavior Characteristics Cooperative;Appropriate to situation  Mood Pleasant  Thought Process  Coherency WDL  Content WDL  Delusions None reported or observed  Perception WDL  Hallucination None reported or observed  Judgment WDL  Confusion None  Danger to Self  Current suicidal ideation? Denies  Agreement Not to Harm Self Yes  Description of Agreement  (Verbal contract)  Danger to Others  Danger to Others None reported or observed
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
## 2022-01-05 NOTE — Progress Notes (Signed)
Patient wanted to know if he could have ointment/med for blister on bilateral feet.  Gave patient band aid and tylenol after recreation.

## 2022-01-06 DIAGNOSIS — F332 Major depressive disorder, recurrent severe without psychotic features: Principal | ICD-10-CM

## 2022-01-06 MED ORDER — HYDROXYZINE HCL 25 MG PO TABS: 25.0000 mg | ORAL_TABLET | Freq: Three times a day (TID) | ORAL | 0 refills | Status: DC | PRN

## 2022-01-06 MED ORDER — TRAZODONE HCL 50 MG PO TABS: 50.0000 mg | ORAL_TABLET | Freq: Every evening | ORAL | 0 refills | Status: DC | PRN

## 2022-01-06 MED ORDER — BUPROPION HCL ER (XL) 300 MG PO TB24: 300.0000 mg | ORAL_TABLET | Freq: Every day | ORAL | 0 refills | Status: DC

## 2022-01-06 MED ORDER — ARIPIPRAZOLE 5 MG PO TABS: 5.0000 mg | ORAL_TABLET | Freq: Every day | ORAL | 0 refills | Status: DC
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
## 2022-01-06 NOTE — Group Note (Signed)
Recreation Therapy Group Note   Group Topic:Stress Management  Group Date: 01/06/2022 Start Time: 0930 End Time: 0950 Facilitators: Caroll Rancher, [place] Location: [address] Dayroom   Goal Area(s) Addresses:  Patient will identify positive stress management techniques. Patient will identify benefits of using stress management post d/c.  Group Description:  Meditation.  LRT played a meditation that focused on meditating for a calm and relaxing mood to start your day.  Patients were to listen as the meditation played to engage in the activity and focus on the affirmations throughout the meditation.    Affect/Mood: Appropriate   Participation Level: Engaged   Participation Quality: Independent   Behavior: Appropriate   Speech/Thought Process: Focused   Insight: Good   Judgement: Good   Modes of Intervention: Meditation   Patient Response to Interventions:  Engaged   Education Outcome:  Acknowledges education and In group clarification offered    Clinical Observations/Individualized Feedback: Pt attended and participated in group session.     Plan: Continue to engage patient in RT group sessions 2-3x/week.   Caroll Rancher, LRT,CTRS 01/06/2022 10:32 AM
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
## 2022-01-06 NOTE — Discharge Instructions (Signed)
Dear Ian Li,  It was a pleasure to take care of you during your stay at [facility] where you were treated for your Major depressive disorder, recurrent episode, severe (HCC).  While you were here, you were:  observed and cared for by our nurses and nursing assistants  treated with medications by your psychiatrists  provided individual and group therapy by therapists  provided resources by our social workers and case managers  Please review the medication list provided to you at discharge and stop, start taking, or continue taking the medications listed there.  You should also follow-up with your primary care doctor, or start seeing one if you don't have one yet. Here are some scheduled follow-ups for you:  Follow-up Information     [name], LCSW Follow up on 01/11/2022.   Why: You have an appointment for therapy services on 01/11/22 at 4:00 pm.  This will be a Virtual appointment. Contact information: [place], [place] [address]  [phone], [phone]        Thedore Mins, MD Follow up on 01/13/2022.   Specialty: Psychiatry Why: You have an appointment for medication management services 01/13/22 at 9:00 am.  This appointment will be Virtual. Contact information: [address] [place] [address] [phone]                 Take care!  Augusto Gamble, MD [facility] (Physician) 01/06/2022 3:32 PM

## 2022-01-06 NOTE — Progress Notes (Signed)
Patient denies SI/HI/AVH this morning. He reported that he slept well. At the time of assessment the patient's status was involuntary but since has been changed to voluntary. Appropriated paperwork has been signed. RN will continue to monitor patient's status and provide interventions as needed.  01/06/22 0735  Psych Admission Type (Psych Patients Only)  Admission Status Involuntary  Psychosocial Assessment  Patient Complaints None  Eye Contact Fair  Facial Expression Flat  Affect Appropriate to circumstance  Speech Logical/coherent  Interaction Assertive  Motor Activity Other (Comment) (WDL)  Appearance/Hygiene Unremarkable  Behavior Characteristics Cooperative  Mood Pleasant  Thought Process  Coherency WDL  Content WDL  Delusions None reported or observed  Perception WDL  Hallucination None reported or observed  Judgment WDL  Confusion None  Danger to Self  Current suicidal ideation? Denies  Agreement Not to Harm Self Yes  Description of Agreement Verbal  Danger to Others  Danger to Others None reported or observed
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
## 2022-01-06 NOTE — Progress Notes (Signed)
   01/06/22 2317  Psych Admission Type (Psych Patients Only)  Admission Status Voluntary  Psychosocial Assessment  Patient Complaints None  Eye Contact Brief  Facial Expression Flat  Affect Appropriate to circumstance  Speech Logical/coherent  Interaction Assertive  Motor Activity Fidgety  Appearance/Hygiene Unremarkable  Behavior Characteristics Appropriate to situation  Mood Pleasant  Thought Process  Coherency WDL  Content WDL  Delusions None reported or observed  Perception WDL  Hallucination None reported or observed  Judgment Limited  Confusion None  Danger to Self  Current suicidal ideation? Denies  Agreement Not to Harm Self Yes  Description of Agreement verbal  Danger to Others  Danger to Others None reported or observed

## 2022-01-06 NOTE — Progress Notes (Signed)
   01/06/22 0614  Sleep  Number of Hours 5.5

## 2022-01-06 NOTE — Group Note (Signed)
LCSW Group Therapy Note   Group Date: 01/06/2022 Start Time: 1300 End Time: 1400   Type of Therapy and Topic:  Group Therapy: Challenging Core Beliefs  Participation Level:  Active  Description of Group:  Patients were educated about core beliefs and asked to identify one harmful core belief that they have. Patients were asked to explore from where those beliefs originate. Patients were asked to discuss how those beliefs make them feel and the resulting behaviors of those beliefs. They were then be asked if those beliefs are true and, if so, what evidence they have to support them. Lastly, group members were challenged to replace those negative core beliefs with helpful beliefs.   Therapeutic Goals:   1. Patient will identify harmful core beliefs and explore the origins of such beliefs. 2. Patient will identify feelings and behaviors that result from those core beliefs. 3. Patient will discuss whether such beliefs are true. 4.  Patient will replace harmful core beliefs with helpful ones.  Summary of Patient Progress:  Ian Li actively engaged in processing and exploring how core beliefs are formed and how they impact thoughts, feelings, and behaviors. Patient proved open to input from peers and feedback from CSW. Patient demonstrated Good insight into the subject matter, was respectful and supportive of peers, and participated throughout the entire session.  Therapeutic Modalities: Cognitive Behavioral Therapy; Solution-Focused Therapy   Ane Payment, LCSWA 01/06/2022  2:09 PM

## 2022-01-07 DIAGNOSIS — F332 Major depressive disorder, recurrent severe without psychotic features: Secondary | ICD-10-CM

## 2022-01-07 MED ORDER — TRAZODONE HCL 50 MG PO TABS: 50.0000 mg | ORAL_TABLET | Freq: Every evening | ORAL | 0 refills | Status: DC | PRN

## 2022-01-07 MED ORDER — BUPROPION HCL ER (XL) 300 MG PO TB24: 300.0000 mg | ORAL_TABLET | Freq: Every day | ORAL | 0 refills | Status: DC

## 2022-01-07 MED ORDER — HYDROXYZINE HCL 25 MG PO TABS: 25.0000 mg | ORAL_TABLET | Freq: Three times a day (TID) | ORAL | 0 refills | Status: AC | PRN

## 2022-01-07 MED ORDER — ARIPIPRAZOLE 5 MG PO TABS: 5.0000 mg | ORAL_TABLET | Freq: Every day | ORAL | 0 refills | Status: DC

## 2022-01-07 NOTE — Progress Notes (Signed)
Pt discharged to lobby. Pt was stable and appreciative at that time. All papers, samples and prescriptions were given. Pt had no belongings in locker. Verbal understanding expressed. Denies SI/HI and A/VH. Pt given opportunity to express concerns and ask questions.

## 2022-01-07 NOTE — Progress Notes (Signed)
Adult Psychoeducational Group Note  Date:  01/07/2022 Time:  5:08 AM  Group Topic/Focus:  Wrap-Up Group:   The focus of this group is to help patients review their daily goal of treatment and discuss progress on daily workbooks.  Participation Level:  Active  Participation Quality:  Appropriate  Affect:  Appropriate  Cognitive:  Appropriate  Insight: Appropriate  Engagement in Group:  Engaged  Modes of Intervention:  Discussion  Additional Comments:  Pt attended and participated in AA group.  Ian Li Katrinka Blazing 01/07/2022, 5:08 AM

## 2022-01-07 NOTE — Progress Notes (Signed)
Patient denies SI/HI/AVS. Patient was concerned about getting enough medication at discharge until he could get back to school and pick up from the [facility]. Samples are to be provided at discharge.   01/07/22 0739  Psych Admission Type (Psych Patients Only)  Admission Status Voluntary  Psychosocial Assessment  Patient Complaints None  Eye Contact Brief  Facial Expression Blank  Affect Appropriate to circumstance  Speech Logical/coherent  Interaction Assertive  Motor Activity Slow  Appearance/Hygiene Unremarkable  Behavior Characteristics Appropriate to situation  Mood Pleasant  Thought Process  Coherency WDL  Content WDL  Delusions None reported or observed  Perception WDL  Hallucination None reported or observed  Judgment WDL  Confusion None  Danger to Self  Current suicidal ideation? Denies  Agreement Not to Harm Self Yes  Description of Agreement Verbal  Danger to Others  Danger to Others None reported or observed

## 2022-01-07 NOTE — Group Note (Signed)
[facility] LCSW Group Therapy Note  Date/Time:    01/07/2022 10:00-11:00AM  Type of Therapy and Topic:  Group Therapy:  Shame and its Impact on My Life  Participation Level:  Active   Description of Group:  The focus of this group was to examine our tendency to be hyper-critical of self and how this leads to feelings of worthlessness, hopelessness, and shame.  Patients were guided to the concept that shame is universal and is worsened by being kept hidden, but improved by being revealed.  We discussed how feeling unworthy is the result of shame and discussed the differences between guilt  ("I did something bad" "I made a mistake" "I did something stupid") and shame ("I am bad" "I am a mistake" "I am stupid") .  We discussed that feelings are not necessarily based in facts.  We also talked about what happens when we do something to numb our negative feelings and how that actually numbs our positive feelings at the same time.  Therapeutic Goals Identify statements patients automatically say to themselves, "I'll be worthy when...."  Examine how this is unkind to ourselves because it indicates we cannot be worthy until some far-reaching, possibly even unreachable, goal is achieved. Talk about the frequent use of unhealthy coping skills used when feeling unworthy Allow patients to discuss their shame out loud in order to reduce its power over them  Summary of Patient Progress: During group, patient expressed "I'll be worthy when I finish the schoolwork that I'm behind in."   He stated he will often do things that distract him, and this can sometimes be positive because it will energize him and at other times it can be negative because he keeps procrastinating.  He was not very engaged in group.  Therapeutic Modalities Processing  Ambrose Mantle, LCSW 01/07/2022, 2:38 PM

## 2022-01-07 NOTE — Progress Notes (Signed)
  [facility] Adult Case Management Discharge Plan :  Will you be returning to the same living situation after discharge:  Yes,  will return to dorm At discharge, do you have transportation home?: Yes,  family Do you have the ability to pay for your medications: Yes,  has insurance  Release of information consent forms completed and emailed to Medical Records, then turned in to Medical Records by CSW.   Patient to Follow up at:  Follow-up Information     [name], LCSW Follow up on 01/11/2022.   Why: You have an appointment for therapy services on 01/11/22 at 4:00 pm.  This will be a Virtual appointment. Contact information: [place], [place] [address]  [phone], [phone]        Thedore Mins, MD Follow up on 01/13/2022.   Specialty: Psychiatry Why: You have an appointment for medication management services 01/13/22 at 9:00 am.  This appointment will be Virtual. Contact information: [address] [place] [address] [phone]                 Next level of care provider has access to [facility] Link:no  Safety Planning and Suicide Prevention discussed: Yes,  with mother     Has patient been referred to the Quitline?: N/A patient is not a smoker  Patient has been referred for addiction treatment: N/A  Lynnell Chad, LCSW 01/07/2022, 8:53 AM

## 2022-01-07 NOTE — [facility] Group Notes (Signed)
Goals Group 01/07/2022   Group Focus: affirmation, clarity of thought, and goals/reality orientation Treatment Modality:  Psychoeducation Interventions utilized were assignment, group exercise, and support Purpose: To be able to understand and verbalize the reason for their admission to the hospital. To understand that the medication helps with their chemical imbalance but they also need to work on their choices in life. To be challenged to develop a list of 30 positives about themselves. Also introduce the concept that "feelings" are not reality.  Participation Level:  Active  Participation Quality:  Appropriate  Affect:  Appropriate  Cognitive:  Appropriate  Insight:  Improving  Engagement in Group:  Engaged  Additional Comments:  10/10. States he is here to learn new coping skills  Dione Housekeeper

## 2022-12-25 ENCOUNTER — Encounter: Payer: Self-pay | Admitting: Family Medicine

## 2022-12-25 ENCOUNTER — Other Ambulatory Visit (HOSPITAL_COMMUNITY)
Admission: RE | Admit: 2022-12-25 | Discharge: 2022-12-25 | Disposition: A | Discharge status: Home / Self Care | Payer: 59 | Source: Ambulatory Visit | Attending: Family Medicine | Admitting: Family Medicine

## 2022-12-25 ENCOUNTER — Ambulatory Visit (INDEPENDENT_AMBULATORY_CARE_PROVIDER_SITE_OTHER): Payer: 59 | Admitting: Family Medicine

## 2022-12-25 VITALS — BP 110/70 | HR 91 | Temp 99.2°F | Ht 72.0 in | Wt 207.5 lb

## 2022-12-25 DIAGNOSIS — Z113 Encounter for screening for infections with a predominantly sexual mode of transmission: Secondary | ICD-10-CM | POA: Insufficient documentation

## 2022-12-25 DIAGNOSIS — Z0000 Encounter for general adult medical examination without abnormal findings: Secondary | ICD-10-CM

## 2022-12-25 DIAGNOSIS — R7989 Other specified abnormal findings of blood chemistry: Secondary | ICD-10-CM

## 2022-12-25 DIAGNOSIS — Q6689 Other  specified congenital deformities of feet: Secondary | ICD-10-CM

## 2022-12-25 DIAGNOSIS — F339 Major depressive disorder, recurrent, unspecified: Secondary | ICD-10-CM | POA: Diagnosis not present

## 2022-12-25 DIAGNOSIS — Z1159 Encounter for screening for other viral diseases: Secondary | ICD-10-CM

## 2022-12-25 DIAGNOSIS — F411 Generalized anxiety disorder: Secondary | ICD-10-CM

## 2022-12-25 DIAGNOSIS — Z114 Encounter for screening for human immunodeficiency virus [HIV]: Secondary | ICD-10-CM

## 2022-12-25 HISTORY — DX: Other specified congenital deformities of feet: Q66.89

## 2022-12-25 NOTE — Patient Instructions (Addendum)
Give [place] 2-3 business days to get the results of your labs back.   Keep the diet clean and stay active.  Aim to do some physical exertion for 150 minutes per week. This is typically divided into 5 days per week, 30 minutes per day. The activity should be enough to get your heart rate up. Anything is better than nothing if you have time constraints.  I recommend getting the flu shot in mid October. This suggestion would change if the CDC comes out with a different recommendation.   Do monthly self testicular checks in the shower. You are feeling for lumps/bumps that don't belong. If you feel anything like this, let me know!  Let us know if you need anything.

## 2022-12-25 NOTE — Progress Notes (Signed)
Chief Complaint  Patient presents with   New Patient (Initial Visit)    Labs today     Well Male Ian Li is here for a complete physical.   His last physical was >1 year ago.  Current diet: in general, diet could be better.   Current exercise: none Weight trend: stable Fatigue out of ordinary? No. Seat belt? Yes.   Advanced directive? No  Health maintenance Tetanus- Yes HIV- no Hep C- No  Past Medical History:  Diagnosis Date   Depression, recurrent (HCC)    GAD (generalized anxiety disorder)      Past Surgical History:  Procedure Laterality Date   TONSILLECTOMY AND ADENOIDECTOMY  2007    Medications  Current Outpatient Medications on File Prior to Visit  Medication Sig Dispense Refill   ARIPiprazole (ABILIFY) 5 MG tablet Take 1 tablet (5 mg total) by mouth daily. 30 tablet 0   buPROPion (WELLBUTRIN XL) 300 MG 24 hr tablet Take 1 tablet (300 mg total) by mouth daily. 30 tablet 0   Allergies No Known Allergies  Family History Family History  Problem Relation Age of Onset   Multiple myeloma Maternal Grandmother    Diabetes Neg Hx    Heart disease Neg Hx     Review of Systems: Constitutional: no fevers or chills Eye:  no recent significant change in vision Ear/Nose/Mouth/Throat:  Ears:  no hearing loss Nose/Mouth/Throat:  no complaints of nasal congestion, no sore throat Cardiovascular:  no chest pain Respiratory:  no shortness of breath Gastrointestinal:  no abdominal pain, no change in bowel habits GU:  Male: negative for dysuria Musculoskeletal/Extremities:  no pain of the joints Integumentary (Skin/Breast):  no abnormal skin lesions reported Neurologic:  no headaches Endocrine: No unexpected weight changes Hematologic/Lymphatic:  no night sweats  Exam BP 110/70 (BP Location: Right Arm, Patient Position: Sitting, Cuff Size: Large)   Pulse 91   Temp 99.2 F (37.3 C) (Oral)   Ht 6' (1.829 m)   Wt 207 lb 8 oz (94.1 kg)   SpO2 97%   BMI 28.14  kg/m  General:  well developed, well nourished, in no apparent distress Skin:  no significant moles, warts, or growths Head:  no masses, lesions, or tenderness Eyes:  pupils equal and round, sclera anicteric without injection Ears:  canals without lesions, TMs shiny without retraction, no obvious effusion, no erythema Nose:  nares patent, mucosa normal Throat/Pharynx:  lips and gingiva without lesion; tongue and uvula midline; non-inflamed pharynx; no exudates or postnasal drainage Neck: neck supple without adenopathy, thyromegaly, or masses Lungs:  clear to auscultation, breath sounds equal bilaterally, no respiratory distress Cardio:  regular rate and rhythm, no bruits, no LE edema Abdomen:  abdomen soft, nontender; bowel sounds normal; no masses or organomegaly Genital (male): Deferred Rectal: Deferred Musculoskeletal:  symmetrical muscle groups noted without atrophy or deformity Extremities:  no clubbing, cyanosis, or edema, no deformities, no skin discoloration Neuro:  gait normal; deep tendon reflexes normal and symmetric Psych: well oriented with normal range of affect and appropriate judgment/insight  Assessment and Plan  Well adult exam - Plan: CBC, Comprehensive metabolic panel, Lipid panel  Depression, recurrent (HCC)  GAD (generalized anxiety disorder)  Screening for HIV without presence of risk factors - Plan: HIV Antibody (routine testing w rflx)  Encounter for hepatitis C screening test for low risk patient - Plan: Hepatitis C antibody  Low vitamin D level - Plan: VITAMIN D 25 Hydroxy (Vit-D Deficiency, Fractures)  Coalition, talocalcaneal  Screening examination  for STI - Plan: Urine cytology ancillary only([place])   Well 22 y.o. male. Counseled on diet and exercise.  Needs to do a bit better with this. He has a history of talocalcaneal coalition.  He has no pain and is recommended to use foot inserts.  He will let me know if he starts having issues again.   If so, we will refer to the foot/ankle team. Self testicular exams recommended at least monthly.  Other orders as above. Follow up in 1 year pending the above workup. The patient voiced understanding and agreement to the plan.  Jilda Roche [place], DO 12/25/22 4:21 PM

## 2022-12-26 ENCOUNTER — Other Ambulatory Visit: Payer: Self-pay | Admitting: Family Medicine

## 2022-12-26 DIAGNOSIS — R7989 Other specified abnormal findings of blood chemistry: Secondary | ICD-10-CM

## 2022-12-26 DIAGNOSIS — E785 Hyperlipidemia, unspecified: Secondary | ICD-10-CM

## 2022-12-26 LAB — COMPREHENSIVE METABOLIC PANEL
ALT: 22 U/L (ref 0–53)
AST: 19 U/L (ref 0–37)
Albumin: 4.4 g/dL (ref 3.5–5.2)
Alkaline Phosphatase: 70 U/L (ref 52–171)
BUN: 11 mg/dL (ref 6–23)
CO2: 32 meq/L (ref 19–32)
Calcium: 9.6 mg/dL (ref 8.4–10.5)
Chloride: 103 mEq/L (ref 96–112)
Creatinine, Ser: 1.46 mg/dL (ref 0.40–1.50)
GFR: 69.08 mL/min (ref >60.00)
Glucose, Bld: 87 mg/dL (ref 70–99)
Potassium: 4.1 meq/L (ref 3.5–5.1)
Sodium: 143 mEq/L (ref 135–145)
Total Bilirubin: 0.3 mg/dL (ref 0.2–1.2)
Total Protein: 7.2 g/dL (ref 6.0–8.3)

## 2022-12-26 LAB — CBC
HCT: 42.8 % (ref 36.0–49.0)
Hemoglobin: 13.7 g/dL (ref 12.0–16.0)
MCHC: 32 g/dL (ref 31.0–37.0)
MCV: 92.2 fl (ref 78.0–98.0)
Platelets: 264 10*3/uL (ref 150.0–575.0)
RBC: 4.64 Mil/uL (ref 3.80–5.70)
RDW: 13.1 % (ref 11.4–15.5)
WBC: 7.1 10*3/uL (ref 4.5–13.5)

## 2022-12-26 LAB — HEPATITIS C ANTIBODY: Hepatitis C Ab: NONREACTIVE

## 2022-12-26 LAB — LIPID PANEL
Cholesterol: 170 mg/dL (ref 0–200)
HDL: 33.2 mg/dL — ABNORMAL LOW (ref >39.00)
NonHDL: 136.8
Total CHOL/HDL Ratio: 5
Triglycerides: 265 mg/dL — ABNORMAL HIGH (ref 0.0–149.0)
VLDL: 53 mg/dL — ABNORMAL HIGH (ref 0.0–40.0)

## 2022-12-26 LAB — VITAMIN D 25 HYDROXY (VIT D DEFICIENCY, FRACTURES): VITD: 10.18 ng/mL — ABNORMAL LOW (ref 30.00–100.00)

## 2022-12-26 LAB — HIV ANTIBODY (ROUTINE TESTING W REFLEX): HIV 1&2 Ab, 4th Generation: NONREACTIVE

## 2022-12-26 LAB — LDL CHOLESTEROL, DIRECT: Direct LDL: 123 mg/dL

## 2022-12-26 MED ORDER — VITAMIN D (ERGOCALCIFEROL) 1.25 MG (50000 UNIT) PO CAPS: 50000.0000 [IU] | ORAL_CAPSULE | ORAL | 0 refills | Status: DC

## 2022-12-26 NOTE — Addendum Note (Signed)
Addended by: Scharlene Gloss B on: 12/26/2022 04:52 PM   Modules accepted: Orders

## 2022-12-27 LAB — URINE CYTOLOGY ANCILLARY ONLY
Chlamydia: NEGATIVE
Comment: NEGATIVE
Comment: NEGATIVE
Comment: NORMAL
Neisseria Gonorrhea: NEGATIVE
Trichomonas: NEGATIVE

## 2023-01-17 ENCOUNTER — Encounter: Payer: Self-pay | Admitting: Family Medicine

## 2023-01-19 ENCOUNTER — Other Ambulatory Visit: Payer: Self-pay

## 2023-01-19 ENCOUNTER — Emergency Department (HOSPITAL_BASED_OUTPATIENT_CLINIC_OR_DEPARTMENT_OTHER)
Admission: EM | Admit: 2023-01-19 | Discharge: 2023-01-19 | Disposition: A | Discharge status: Home / Self Care | Payer: 59 | Attending: Emergency Medicine | Admitting: Emergency Medicine

## 2023-01-19 DIAGNOSIS — J029 Acute pharyngitis, unspecified: Secondary | ICD-10-CM | POA: Diagnosis not present

## 2023-01-19 DIAGNOSIS — Z202 Contact with and (suspected) exposure to infections with a predominantly sexual mode of transmission: Secondary | ICD-10-CM | POA: Diagnosis present

## 2023-01-19 DIAGNOSIS — Z20822 Contact with and (suspected) exposure to covid-19: Secondary | ICD-10-CM | POA: Insufficient documentation

## 2023-01-19 LAB — SARS CORONAVIRUS 2 BY RT PCR: SARS Coronavirus 2 by RT PCR: NEGATIVE

## 2023-01-19 LAB — URINALYSIS, ROUTINE W REFLEX MICROSCOPIC
Bilirubin Urine: NEGATIVE
Glucose, UA: NEGATIVE mg/dL
Ketones, ur: NEGATIVE mg/dL
Leukocytes,Ua: NEGATIVE
Nitrite: NEGATIVE
Protein, ur: NEGATIVE mg/dL
Specific Gravity, Urine: >=1.03 (ref 1.005–1.030)
pH: 6 (ref 5.0–8.0)

## 2023-01-19 LAB — URINALYSIS, MICROSCOPIC (REFLEX)

## 2023-01-19 LAB — GROUP A STREP BY PCR: Group A Strep by PCR: NOT DETECTED

## 2023-01-19 MED ORDER — DOXYCYCLINE HYCLATE 100 MG PO CAPS: 100.0000 mg | ORAL_CAPSULE | Freq: Two times a day (BID) | ORAL | 0 refills | Status: DC

## 2023-01-19 NOTE — ED Provider Notes (Signed)
[place] [facility] AT [facility] Provider Note   CSN: [id number] Arrival date & time: 01/19/23  1212     History  Chief Complaint  Patient presents with   Exposure to STD    Ian Li is a 22 y.o. male who presents emergency department with complaint of sore throat.  Patient reports that he has had 2 days of sore throat and mild pain with swallowing.  He is concerned because his partner just tested positive for chlamydia and he has recently performed oral intercourse with her.  He is sexually active with a single male partner.  He denies any penile symptoms including discharge, testicle pain or dysuria.  He denies fever or chills.   Exposure to STD       Home Medications Prior to Admission medications   Medication Sig Start Date End Date Taking? Authorizing Provider  ARIPiprazole (ABILIFY) 5 MG tablet Take 1 tablet (5 mg total) by mouth daily. 01/07/22 02/06/22  Sarita Bottom, MD  buPROPion (WELLBUTRIN XL) 300 MG 24 hr tablet Take 1 tablet (300 mg total) by mouth daily. 01/07/22 02/06/22  Sarita Bottom, MD  Vitamin D, Ergocalciferol, (DRISDOL) 1.25 MG (50000 UNIT) CAPS capsule Take 1 capsule (50,000 Units total) by mouth every 7 (seven) days. 12/26/22   Sharlene Dory, DO      Allergies    Patient has no known allergies.    Review of Systems   Review of Systems  Physical Exam Updated Vital Signs BP 136/85   Pulse 87   Temp 97.6 F (36.4 C) (Oral)   Resp 17   SpO2 97%  Physical Exam Vitals and nursing note reviewed.  Constitutional:      General: He is not in acute distress.    Appearance: He is well-developed. He is not diaphoretic.  HENT:     Head: Normocephalic and atraumatic.     Mouth/Throat:     Mouth: Mucous membranes are moist.     Pharynx: Uvula midline. Posterior oropharyngeal erythema present. No pharyngeal swelling, oropharyngeal exudate or uvula swelling.     Tonsils: No tonsillar exudate or tonsillar abscesses.   Eyes:     General: No scleral icterus.    Conjunctiva/sclera: Conjunctivae normal.  Cardiovascular:     Rate and Rhythm: Normal rate and regular rhythm.     Heart sounds: Normal heart sounds.  Pulmonary:     Effort: Pulmonary effort is normal. No respiratory distress.     Breath sounds: Normal breath sounds.  Abdominal:     Palpations: Abdomen is soft.     Tenderness: There is no abdominal tenderness.  Musculoskeletal:     Cervical back: Normal range of motion and neck supple.  Skin:    General: Skin is warm and dry.  Neurological:     Mental Status: He is alert.  Psychiatric:        Behavior: Behavior normal.     ED Results / Procedures / Treatments   Labs (all labs ordered are listed, but only abnormal results are displayed) Labs Reviewed - No data to display  EKG None  Radiology No results found.  Procedures Procedures    Medications Ordered in ED Medications - No data to display  ED Course/ Medical Decision Making/ A&P                                 Medical Decision Making 22 year old male with exposure to  chlamydia.  No urinary symptoms.  I reviewed patient's labs.  Urine does not appear to be infected.  Few bacteria in the urine likely skin contamination.  Patient's group A strep and COVID test are both negative.  Patient we treated with 1 week of doxycycline.  Specific STI precautions, treatment course and safe sex practices discussed.  Appropriate for discharge at this time.  Amount and/or Complexity of Data Reviewed Labs: ordered.  Risk Prescription drug management.           Final Clinical Impression(s) / ED Diagnoses Final diagnoses:  Exposure to STD    Rx / DC Orders ED Discharge Orders     None         Arthor Captain, PA-C 01/19/23 1434    Loetta Rough, MD 01/19/23 1450

## 2023-01-19 NOTE — Discharge Instructions (Signed)
You are being treated for exposure to chlamydia.  I have ordered doxycycline to your pharmacy.  You must complete the entire course of antibiotic before you are considered cleared of the infection.  Your partner should be fully treated before you have any form of intercourse.  Your strep test and COVID test were both negative for infection.  Get help right away if you have an inability to swallow your own saliva, severe pain, change in voice, difficulty breathing or any new or worsening symptoms or concern.

## 2023-01-19 NOTE — ED Triage Notes (Signed)
Patient presents to ED via POV from home. Here requesting STD throat swab. Reports partner recently tested positive for chlamydia. Reports sore throat. Denies any other symptoms.

## 2023-01-21 ENCOUNTER — Encounter: Payer: Self-pay | Admitting: Family Medicine

## 2023-01-22 LAB — GC/CHLAMYDIA PROBE AMP (~~LOC~~) NOT AT ARMC
Chlamydia: NEGATIVE
Comment: NEGATIVE
Comment: NORMAL
Neisseria Gonorrhea: NEGATIVE

## 2023-01-23 ENCOUNTER — Ambulatory Visit: Payer: 59 | Admitting: Family Medicine

## 2023-01-24 ENCOUNTER — Ambulatory Visit: Payer: 59 | Admitting: Family Medicine

## 2023-01-24 ENCOUNTER — Encounter: Payer: Self-pay | Admitting: Family Medicine

## 2023-01-24 VITALS — BP 110/70 | HR 82 | Temp 99.0°F | Ht 72.0 in | Wt 208.5 lb

## 2023-01-24 DIAGNOSIS — Z202 Contact with and (suspected) exposure to infections with a predominantly sexual mode of transmission: Secondary | ICD-10-CM | POA: Diagnosis not present

## 2023-01-24 NOTE — Patient Instructions (Signed)
Practice safe sex for 7 full days after you finish the medication.   Let me know if you have new symptoms.   Let us know if you need anything.

## 2023-01-24 NOTE — Progress Notes (Signed)
Chief Complaint  Patient presents with   Personal Problem    Subjective: Patient is a 22 y.o. male here for ED f/u.  Tx'd for chlamydia. Finishing up a 7 d course of doxy right now. Compliant, no AE's. Has some questions. Not having a sore throat any longer.   Past Medical History:  Diagnosis Date   Depression, recurrent (HCC)    GAD (generalized anxiety disorder)     Objective: BP 110/70 (BP Location: Left Arm, Patient Position: Sitting, Cuff Size: Normal)   Pulse 82   Temp 99 F (37.2 C) (Oral)   Ht 6' (1.829 m)   Wt 208 lb 8 oz (94.6 kg)   SpO2 97%   BMI 28.28 kg/m  General: Awake, appears stated age Lungs: No accessory muscle use Psych: Age appropriate judgment and insight, normal affect and mood  Assessment and Plan: Exposure to STD  Possibly had PO chlamydia. Finish doxy. Safe sex for 7 d following. OK to masturbate. No need to retest if not having symptoms. He will let me know if anything else comes up. F/u as originally scheduled.  The patient voiced understanding and agreement to the plan.  I spent 22 min w the pt discussing the above plan in addition to reviewing the chart on the same day of the visit.   Jilda Roche [place], DO 01/24/23  3:49 PM

## 2023-01-27 ENCOUNTER — Encounter: Payer: Self-pay | Admitting: Family Medicine

## 2023-01-29 ENCOUNTER — Other Ambulatory Visit (HOSPITAL_COMMUNITY)
Admission: RE | Admit: 2023-01-29 | Discharge: 2023-01-29 | Disposition: A | Discharge status: Home / Self Care | Payer: 59 | Source: Ambulatory Visit | Attending: Family Medicine | Admitting: Family Medicine

## 2023-01-29 ENCOUNTER — Encounter: Payer: Self-pay | Admitting: Family Medicine

## 2023-01-29 ENCOUNTER — Ambulatory Visit (INDEPENDENT_AMBULATORY_CARE_PROVIDER_SITE_OTHER): Payer: 59 | Admitting: Family Medicine

## 2023-01-29 VITALS — BP 118/78 | HR 69 | Temp 98.7°F | Ht 72.0 in | Wt 207.0 lb

## 2023-01-29 DIAGNOSIS — R41840 Attention and concentration deficit: Secondary | ICD-10-CM

## 2023-01-29 DIAGNOSIS — Z113 Encounter for screening for infections with a predominantly sexual mode of transmission: Secondary | ICD-10-CM | POA: Diagnosis present

## 2023-01-29 NOTE — Progress Notes (Signed)
Chief Complaint  Patient presents with   Exposure to STD    Subjective: Patient is a 22 y.o. male here for fu.  Patient was potentially exposed to an STD after he performed oral sex on a male tested positive for chlamydia.  He received 7 days of doxycycline and his symptoms subsequently improved after 2 to 3 days on the medication.  He finished the course and had no further symptoms.  Due to fear/anxiety, he is requesting repeat testing.  No sore throat, abdominal pain, fevers, bleeding, discharge, or urinary complaints.  He did have some intermittent testicular pain yesterday but that has since resolved.  The patient has had inattention since elementary school.  He has never had a formal evaluation for ADHD but is wishing to have it.  Past Medical History:  Diagnosis Date   Depression, recurrent (HCC)    GAD (generalized anxiety disorder)     Objective: BP 118/78 (BP Location: Left Arm, Patient Position: Sitting, Cuff Size: Normal)   Pulse 69   Temp 98.7 F (37.1 C) (Oral)   Ht 6' (1.829 m)   Wt 207 lb (93.9 kg)   SpO2 97%   BMI 28.07 kg/m  General: Awake, appears stated age Heart: RRR, no LE edema Lungs: CTAB, no rales, wheezes or rhonchi. No accessory muscle use Abdomen: Bowel sounds present, soft, nontender, nondistended Psych: Age appropriate judgment and insight, normal affect and mood  Assessment and Plan: Inattention - Plan: Ambulatory referral to Psychology  Screening examination for STI - Plan: Urine cytology ancillary only([place]), Cervicovaginal ancillary only( [place])  Refer to behavioral health for formal evaluation. Check above.  Will do a swab of the tonsils given his oral sex. The patient voiced understanding and agreement to the plan.  Jilda Roche [place], DO 01/29/23  3:29 PM

## 2023-01-29 NOTE — Patient Instructions (Signed)
Give [place] 4-5 business days to get the results of your labs back.   If you do not hear anything about your referral in the next 1-2 weeks, call our office and ask for an update.  Let us know if you need anything.

## 2023-01-31 LAB — CYTOLOGY, (ORAL, ANAL, URETHRAL) ANCILLARY ONLY
Chlamydia: NEGATIVE
Comment: NEGATIVE
Comment: NORMAL
Neisseria Gonorrhea: NEGATIVE

## 2023-01-31 LAB — URINE CYTOLOGY ANCILLARY ONLY
Chlamydia: NEGATIVE
Comment: NEGATIVE
Comment: NEGATIVE
Comment: NORMAL
Neisseria Gonorrhea: NEGATIVE
Trichomonas: NEGATIVE

## 2023-02-23 ENCOUNTER — Ambulatory Visit (HOSPITAL_COMMUNITY): Admission: EM | Admit: 2023-02-23 | Discharge: 2023-02-23 | Discharge status: Against Medical Advice | Payer: 59

## 2023-02-23 NOTE — Progress Notes (Signed)
   02/23/23 1415  [facility] Triage Screening (Walk-ins at [facility] only)  How Did You Hear About [place]? Self  What Is the Reason for Your Visit/Call Today? Ian Li is a 22 year old male who presents to [facility] voluntarily unaccompanied seeking testing for autism. Patient states he believes he may be on the autism spectrum and his school counselor recommended that he come to this facility for resources. Patient denies SI/HI and AVH. Patient left without being seen by a provider. He signed the waiver of medical screening exam.  How Long Has This Been Causing You Problems? <Week  Have You Recently Had Any Thoughts About Hurting Yourself? No  Are You Planning to Commit Suicide/Harm Yourself At This time? No  Have you Recently Had Thoughts About Hurting Someone Ian Li? No  Are You Planning To Harm Someone At This Time? No  Are you currently experiencing any auditory, visual or other hallucinations? No  Have You Used Any Alcohol or Drugs in the Past 24 Hours? No  Do you have any current medical co-morbidities that require immediate attention? No  Clinician description of patient physical appearance/behavior: calm, cooperative  What Do You Feel Would Help You the Most Today? Social Support  If access to [facility] was not available, would you have sought care in the Emergency Department? No  Determination of Need Routine (7 days)  Options For Referral Outpatient Therapy;Other: Comment

## 2023-02-23 NOTE — ED Notes (Addendum)
Patient left without being seen.

## 2023-03-19 ENCOUNTER — Other Ambulatory Visit: Payer: Self-pay | Admitting: Family Medicine

## 2023-03-26 ENCOUNTER — Other Ambulatory Visit (INDEPENDENT_AMBULATORY_CARE_PROVIDER_SITE_OTHER): Payer: 59

## 2023-03-26 DIAGNOSIS — E785 Hyperlipidemia, unspecified: Secondary | ICD-10-CM | POA: Diagnosis not present

## 2023-03-26 DIAGNOSIS — R7989 Other specified abnormal findings of blood chemistry: Secondary | ICD-10-CM

## 2023-03-27 LAB — LIPID PANEL
Cholesterol: 195 mg/dL (ref 0–200)
HDL: 38.5 mg/dL — ABNORMAL LOW (ref >39.00)
LDL Cholesterol: 121 mg/dL — ABNORMAL HIGH (ref 0–99)
NonHDL: 156.21
Total CHOL/HDL Ratio: 5
Triglycerides: 174 mg/dL — ABNORMAL HIGH (ref 0.0–149.0)
VLDL: 34.8 mg/dL (ref 0.0–40.0)

## 2023-03-27 LAB — VITAMIN D 25 HYDROXY (VIT D DEFICIENCY, FRACTURES): VITD: 11.91 ng/mL — ABNORMAL LOW (ref 30.00–100.00)

## 2023-04-04 ENCOUNTER — Other Ambulatory Visit: Payer: Self-pay

## 2023-05-09 ENCOUNTER — Other Ambulatory Visit (HOSPITAL_COMMUNITY)
Admission: RE | Admit: 2023-05-09 | Discharge: 2023-05-09 | Disposition: A | Discharge status: Home / Self Care | Payer: 59 | Source: Ambulatory Visit | Attending: Family Medicine | Admitting: Family Medicine

## 2023-05-09 ENCOUNTER — Ambulatory Visit (INDEPENDENT_AMBULATORY_CARE_PROVIDER_SITE_OTHER): Payer: 59 | Admitting: Family Medicine

## 2023-05-09 VITALS — BP 112/70 | HR 81 | Temp 98.0°F | Resp 16 | Ht 72.0 in | Wt 218.0 lb

## 2023-05-09 DIAGNOSIS — Z114 Encounter for screening for human immunodeficiency virus [HIV]: Secondary | ICD-10-CM

## 2023-05-09 DIAGNOSIS — Z113 Encounter for screening for infections with a predominantly sexual mode of transmission: Secondary | ICD-10-CM | POA: Insufficient documentation

## 2023-05-09 NOTE — Patient Instructions (Signed)
 Practice safe sex.  Give [place] 3-4 business days to get the results of your labs back.   Let us know if you need anything.

## 2023-05-09 NOTE — Progress Notes (Signed)
 Chief Complaint  Patient presents with   STD Test    Discuss STD testing    Subjective: Patient is a 23 y.o. male here for STD screening.  Pt has a new partner and would like to be tested for STIs prior to sexual relations. No urinary complaints, fevers, abdominal pain, skin lesions, urinary complaints, discharge, testicular pain.  No known exposures.    Past Medical History:  Diagnosis Date   Depression, recurrent (HCC)    GAD (generalized anxiety disorder)     Objective: BP 112/70   Pulse 81   Temp 98 F (36.7 C)   Resp 16   Ht 6' (1.829 m)   Wt 218 lb (98.9 kg)   SpO2 99%   BMI 29.57 kg/m  General: Awake, appears stated age Heart: RRR Lungs: CTAB, no rales, wheezes or rhonchi. No accessory muscle use Abdomen: Bowel sounds present, soft, nontender, nondistended Psych: Age appropriate judgment and insight, normal affect and mood  Assessment and Plan: Screening examination for STI - Plan: Urine cytology ancillary only([place])  Screening for HIV without presence of risk factors - Plan: HIV Antibody (routine testing w rflx)  Practice safe sex.  Check gonorrhea, chlamydia, trichomonas, and HIV as above.  Follow-up as originally scheduled or as needed. The patient voiced understanding and agreement to the plan.  Mabel [place], DO 05/09/23  9:04 AM

## 2023-05-10 LAB — HIV ANTIBODY (ROUTINE TESTING W REFLEX): HIV 1&2 Ab, 4th Generation: NONREACTIVE

## 2023-05-10 LAB — URINE CYTOLOGY ANCILLARY ONLY
Chlamydia: NEGATIVE
Comment: NEGATIVE
Comment: NEGATIVE
Comment: NORMAL
Neisseria Gonorrhea: NEGATIVE
Trichomonas: NEGATIVE

## 2023-05-15 ENCOUNTER — Ambulatory Visit: Payer: Self-pay | Admitting: Family Medicine

## 2023-08-03 ENCOUNTER — Ambulatory Visit (INDEPENDENT_AMBULATORY_CARE_PROVIDER_SITE_OTHER): Admitting: Family Medicine

## 2023-08-03 ENCOUNTER — Encounter: Payer: Self-pay | Admitting: Family Medicine

## 2023-08-03 VITALS — BP 116/72 | HR 75 | Ht 72.0 in | Wt 207.0 lb

## 2023-08-03 DIAGNOSIS — Z833 Family history of diabetes mellitus: Secondary | ICD-10-CM | POA: Diagnosis not present

## 2023-08-03 DIAGNOSIS — R7989 Other specified abnormal findings of blood chemistry: Secondary | ICD-10-CM

## 2023-08-03 DIAGNOSIS — R5383 Other fatigue: Secondary | ICD-10-CM

## 2023-08-03 NOTE — Progress Notes (Signed)
 Chief Complaint  Patient presents with   Acute Visit    Patient presents today for fatigue for about 1 month now.    Subjective: Patient is a 23 y.o. adult here for fatigue.  Going on for 1 mo. Pt feels lightheaded and weak intermittently. It lasts for 1-4 hrs. It happened before a few mo ago. Felt like pt was going to pass out but did not have LOC. First time it happened a few mo ago, pt was at school doing nothing in particular. Pt had it happen 1-2 weeks ago and another episode 4 d ago. 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 Pt was not overly exerting. Could be better with hydration. No N/V/D, bleeding, shortness of breath, palpitations, EtOH or caffeine usage.   Past Medical History:  Diagnosis Date   Depression, recurrent (HCC)    GAD (generalized anxiety disorder)     Objective: BP 116/72   Pulse 75   Ht 6' (1.829 m)   Wt 207 lb (93.9 kg)   SpO2 97%   BMI 28.07 kg/m  General: Awake, appears stated age Heart: RRR, no LE edema, no bruits Neuro: DTR's equal and symmetric in LE's, no clonus, gait nml Lungs: CTAB, no rales, wheezes or rhonchi. No accessory muscle use Psych: Age appropriate judgment and insight, normal affect and mood  Assessment and Plan: Fatigue, unspecified type - Plan: CBC, Comprehensive metabolic panel with GFR, Iron, TIBC and Ferritin Panel, Vitamin B12  Low vitamin D level - Plan: Vitamin D (25 hydroxy)  Family history of diabetes mellitus - Plan: Hemoglobin A1c  Ck labs. Could be Vit D as this is similar to prior episodes. Needs to improve hydration.  If labs are normal and he is still having symptoms, could consider a Zio patch for 14 days.  Sherron Ales will let us know. The patient voiced understanding and agreement to the plan.  I spent 21 minutes with the patient discussing the above plan in addition to reviewing the pt's chart on the same day of the visit.  Jilda Roche [place], DO 08/03/23  3:14 PM

## 2023-08-03 NOTE — Patient Instructions (Signed)
 Try to drink 55-60 oz of water daily outside of exercise.  Give [place] 2-3 business days to get the results of your labs back.   Keep the diet clean and stay active.  Let us know if you need anything.

## 2023-08-04 ENCOUNTER — Other Ambulatory Visit: Payer: Self-pay | Admitting: Family Medicine

## 2023-08-04 ENCOUNTER — Encounter: Payer: Self-pay | Admitting: Family Medicine

## 2023-08-04 LAB — COMPREHENSIVE METABOLIC PANEL WITH GFR
AG Ratio: 1.9 (calc) (ref 1.0–2.5)
ALT: 8 U/L — ABNORMAL LOW (ref 9–46)
AST: 14 U/L (ref 10–40)
Albumin: 4.5 g/dL (ref 3.6–5.1)
Alkaline phosphatase (APISO): 60 U/L (ref 36–130)
BUN: 9 mg/dL (ref 7–25)
CO2: 28 mmol/L (ref 20–32)
Calcium: 9.5 mg/dL (ref 8.6–10.3)
Chloride: 105 mmol/L (ref 98–110)
Creat: 0.85 mg/dL (ref 0.60–1.24)
Globulin: 2.4 g/dL (ref 1.9–3.7)
Glucose, Bld: 81 mg/dL (ref 65–99)
Potassium: 4.1 mmol/L (ref 3.5–5.3)
Sodium: 140 mmol/L (ref 135–146)
Total Bilirubin: 0.4 mg/dL (ref 0.2–1.2)
Total Protein: 6.9 g/dL (ref 6.1–8.1)
eGFR: 128 mL/min/{1.73_m2} (ref >=60)

## 2023-08-04 LAB — IRON,TIBC AND FERRITIN PANEL
%SAT: 26 % (ref 20–48)
Ferritin: 82 ng/mL (ref 38–380)
Iron: 82 ug/dL (ref 50–195)
TIBC: 310 ug/dL (ref 250–425)

## 2023-08-04 LAB — CBC
HCT: 40.1 % (ref 38.5–50.0)
Hemoglobin: 13.4 g/dL (ref 13.2–17.1)
MCH: 29.8 pg (ref 27.0–33.0)
MCHC: 33.4 g/dL (ref 32.0–36.0)
MCV: 89.1 fL (ref 80.0–100.0)
MPV: 11.4 fL (ref 7.5–12.5)
Platelets: 257 10*3/uL (ref 140–400)
RBC: 4.5 10*6/uL (ref 4.20–5.80)
RDW: 12.4 % (ref 11.0–15.0)
WBC: 6.5 10*3/uL (ref 3.8–10.8)

## 2023-08-04 LAB — HEMOGLOBIN A1C
Hgb A1c MFr Bld: 5.3 %{Hb} (ref <5.7)
Mean Plasma Glucose: 105 mg/dL
eAG (mmol/L): 5.8 mmol/L

## 2023-08-04 LAB — VITAMIN D 25 HYDROXY (VIT D DEFICIENCY, FRACTURES): Vit D, 25-Hydroxy: 22 ng/mL — ABNORMAL LOW (ref 30–100)

## 2023-08-04 LAB — VITAMIN B12: Vitamin B-12: 478 pg/mL (ref 200–1100)

## 2023-08-04 MED ORDER — VITAMIN D (ERGOCALCIFEROL) 1.25 MG (50000 UNIT) PO CAPS: 50000.0000 [IU] | ORAL_CAPSULE | ORAL | 0 refills | Status: DC

## 2023-08-06 ENCOUNTER — Other Ambulatory Visit: Payer: Self-pay

## 2023-08-06 DIAGNOSIS — R7989 Other specified abnormal findings of blood chemistry: Secondary | ICD-10-CM

## 2023-10-28 ENCOUNTER — Other Ambulatory Visit: Payer: Self-pay | Admitting: Family Medicine

## 2023-12-30 ENCOUNTER — Encounter: Payer: Self-pay | Admitting: Family Medicine

## 2024-01-14 ENCOUNTER — Ambulatory Visit (INDEPENDENT_AMBULATORY_CARE_PROVIDER_SITE_OTHER): Admitting: Family Medicine

## 2024-01-14 ENCOUNTER — Encounter: Payer: Self-pay | Admitting: Family Medicine

## 2024-01-14 VITALS — BP 118/72 | HR 96 | Temp 98.0°F | Resp 16 | Ht 73.0 in | Wt 217.0 lb

## 2024-01-14 DIAGNOSIS — F640 Transsexualism: Secondary | ICD-10-CM | POA: Diagnosis not present

## 2024-01-14 DIAGNOSIS — R41840 Attention and concentration deficit: Secondary | ICD-10-CM | POA: Diagnosis not present

## 2024-01-14 DIAGNOSIS — Z23 Encounter for immunization: Secondary | ICD-10-CM

## 2024-01-14 DIAGNOSIS — R7989 Other specified abnormal findings of blood chemistry: Secondary | ICD-10-CM | POA: Diagnosis not present

## 2024-01-14 NOTE — Progress Notes (Signed)
 Chief Complaint  Patient presents with   Follow-up    Follow Up Testing    Subjective: Patient is a 23 y.o. adult here for f/u.  Patient has a history of vitamin D  deficiency.  He is for for follow-up for this.  He started taking 50,000 units weekly.  He reports compliance and no adverse effects.  He reports feeling better energy and less lightheadedness associated with his day-to-day activities.  Since early elementary school, the patient has had difficulty focusing on tasks and paying attention.  He has never had a formal evaluation for autism or ADHD.  There were no concerns from his pediatrician with the former.  His brother has been diagnosed with ADHD.  He is unsure what medicine he is on.  He has never tried any medicine for this in the past.  He has never had a formal evaluation.  The patient, over the past several years, has felt he wishes to be more feminine.  He is interested in hormonal treatment.  He is not interested in any surgeries.  Past Medical History:  Diagnosis Date   Depression, recurrent (HCC)    GAD (generalized anxiety disorder)     Objective: BP 118/72 (BP Location: Left Arm, Patient Position: Sitting)   Pulse 96   Temp 98 F (36.7 C) (Oral)   Resp 16   Ht 6' 1 (1.854 m)   Wt 217 lb (98.4 kg)   SpO2 98%   BMI 28.63 kg/m  General: Awake, appears stated age Heart: RRR, no LE edema Lungs: CTAB, no rales, wheezes or rhonchi. 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 Politely declined empiric tx. Pt will let us  know if changes mind.  Reck Vit D. Cont supplementation. Refer to [facility]. MenB updated today. Flu shot today. F/u pending above.  The patient voiced understanding and agreement to the plan.  I spent 30 min w the pt discussing the above plans in addition to reviewing their chart on the same day of the visit.   Mabel Mt [place], DO 01/14/24  3:38 PM

## 2024-01-14 NOTE — Patient Instructions (Addendum)
 If you do not hear anything about your referrals in the next 1-2 weeks, call our office and ask for an update.  Give us  2-3 business days to get the results of your labs back.   Heat (pad or rice pillow in microwave) over affected area, 10-15 minutes twice daily.   Ice/cold pack over area for 10-15 min twice daily.  OK to take Tylenol  1000 mg (2 extra strength tabs) or 975 mg (3 regular strength tabs) every 6 hours as needed.  Let us  know if you need anything.

## 2024-01-15 ENCOUNTER — Ambulatory Visit: Payer: Self-pay | Admitting: Family Medicine

## 2024-01-15 LAB — VITAMIN D 25 HYDROXY (VIT D DEFICIENCY, FRACTURES): VITD: 46.45 ng/mL (ref 30.00–100.00)

## 2024-01-27 ENCOUNTER — Encounter: Payer: Self-pay | Admitting: Family Medicine

## 2024-02-09 ENCOUNTER — Encounter (HOSPITAL_BASED_OUTPATIENT_CLINIC_OR_DEPARTMENT_OTHER): Payer: Self-pay | Admitting: Emergency Medicine

## 2024-02-09 ENCOUNTER — Emergency Department (HOSPITAL_BASED_OUTPATIENT_CLINIC_OR_DEPARTMENT_OTHER)

## 2024-02-09 ENCOUNTER — Other Ambulatory Visit: Payer: Self-pay

## 2024-02-09 ENCOUNTER — Emergency Department (HOSPITAL_BASED_OUTPATIENT_CLINIC_OR_DEPARTMENT_OTHER): Admission: EM | Admit: 2024-02-09 | Discharge: 2024-02-09 | Disposition: A | Discharge status: Home / Self Care

## 2024-02-09 DIAGNOSIS — Y92410 Unspecified street and highway as the place of occurrence of the external cause: Secondary | ICD-10-CM | POA: Insufficient documentation

## 2024-02-09 DIAGNOSIS — G8911 Acute pain due to trauma: Secondary | ICD-10-CM | POA: Insufficient documentation

## 2024-02-09 DIAGNOSIS — M546 Pain in thoracic spine: Secondary | ICD-10-CM | POA: Insufficient documentation

## 2024-02-09 MED ORDER — IBUPROFEN 800 MG PO TABS: 800.0000 mg | ORAL_TABLET | Freq: Three times a day (TID) | ORAL | 0 refills | Status: DC | PRN

## 2024-02-09 MED ORDER — LIDOCAINE 5 % EX PTCH
1.0000 | MEDICATED_PATCH | CUTANEOUS | Status: DC
  Administered 2024-02-09: 1 via TRANSDERMAL
  Filled 2024-02-09: qty 1

## 2024-02-09 MED ORDER — ACETAMINOPHEN 325 MG PO TABS
650.0000 mg | ORAL_TABLET | Freq: Once | ORAL | Status: AC
  Administered 2024-02-09: 650 mg via ORAL
  Filled 2024-02-09: qty 2

## 2024-02-09 MED ORDER — METHOCARBAMOL 500 MG PO TABS
500.0000 mg | ORAL_TABLET | Freq: Once | ORAL | Status: AC
  Administered 2024-02-09: 500 mg via ORAL
  Filled 2024-02-09: qty 1

## 2024-02-09 MED ORDER — METHOCARBAMOL 500 MG PO TABS: 500.0000 mg | ORAL_TABLET | Freq: Two times a day (BID) | ORAL | 0 refills | Status: DC | PRN

## 2024-02-09 MED ORDER — LIDOCAINE 5 % EX PTCH: 1.0000 | MEDICATED_PATCH | CUTANEOUS | 0 refills | Status: DC
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
## 2024-03-06 NOTE — Telephone Encounter (Signed)
 Referral has been re-faxed to the number provided.

## 2024-05-12 ENCOUNTER — Telehealth: Payer: Self-pay

## 2024-05-12 NOTE — Telephone Encounter (Signed)
 Initial Comment Caller states Ian Li scheduled an appt. for tomorrow for memory issues and sharp heart pain. Translation No Nurse Assessment Nurse: Vonzell, RN, Deanna Date/Time (Eastern Time): 05/12/2024 10:19:09 AM Confirm and document reason for call. If symptomatic, describe symptoms. ---Caller states right now Ian Li is not feeling anything. Ian Li said the pain is random and he's unsure if the pain is chest pain or heart pain. Ian Li describes pain as sharp when it happens and it causes trouble breathing and inability to move. Ian Li says pain normally lasts less than a minute. Right now Ian Li is not feeling any pain Does the patient have any new or worsening symptoms? ---Yes Will a triage be completed? ---Yes Related visit to physician within the last 2 weeks? ---No Does the Ian Li have any chronic conditions? (i.e. diabetes, asthma, this includes High risk factors for pregnancy, etc.) ---Yes List chronic conditions. ---Sports induced asthma. Is this a behavioral health or substance abuse call? ---No Guidelines Guideline Title Affirmed Question Affirmed Notes Nurse Date/Time (Eastern Time) Chest Pain [1] Chest pain from known angina comes and goes AND [2] is NOT happening more often (increasing in frequency) or getting Vonzell, RN, Deanna 05/12/2024 10:21:06 AM PLEASE NOTE: All timestamps contained within this report are represented as Eastern Standard Time. CONFIDENTIALTY NOTICE: This fax transmission is intended only for the addressee. It contains information that is legally privileged, confidential or otherwise protected from use or disclosure. If you are not the intended recipient, you are strictly prohibited from reviewing, disclosing, copying using or disseminating any of this information or taking any action in reliance on or regarding this information. If you have received this fax in error, please notify us  immediately by telephone so that we can arrange for its return to us . Phone:  [phone], Toll-Free: [phone], Fax: [phone] [email or web address] 01/26/2001 Page: 1 of2 CallId: [id number] Guidelines Guideline Title Affirmed Question Affirmed Notes Nurse Date/Time Titus Time) worse (increasing in severity) Disp. Time Titus Time) Disposition Final User 05/12/2024 10:17:31 AM Send to Urgent Queue Rogers Oris 05/12/2024 10:24:49 AM SEE PCP WITHIN 3 DAYS Yes Vonzell, RN, Deanna Final Disposition 05/12/2024 10:24:49 AM SEE PCP WITHIN 3 DAYS Yes Vonzell, RN, Deanna Caller Disagree/Comply Comply Caller Understands Yes PreDisposition Call Doctor Care Advice Given Per Guideline * You need to be seen within 2 or 3 days. CALL BACK IF: * Difficulty breathing or unusual sweating occurs * Chest pain increases in frequency, duration or severity * Chest pain lasts over 5 minutes * You become worse

## 2024-05-12 NOTE — Telephone Encounter (Signed)
Appt tomorrow w/ PCP.

## 2024-05-13 ENCOUNTER — Ambulatory Visit: Admitting: Family Medicine

## 2024-05-13 ENCOUNTER — Encounter: Payer: Self-pay | Admitting: Family Medicine

## 2024-05-13 VITALS — BP 128/80 | HR 82 | Temp 98.0°F | Resp 16 | Ht 73.0 in | Wt 215.0 lb

## 2024-05-13 DIAGNOSIS — R41840 Attention and concentration deficit: Secondary | ICD-10-CM

## 2024-05-13 DIAGNOSIS — R0789 Other chest pain: Secondary | ICD-10-CM | POA: Insufficient documentation

## 2024-05-13 HISTORY — DX: Other chest pain: R07.89

## 2024-05-13 MED ORDER — ESTRADIOL 1 MG PO TABS: 1.0000 mg | ORAL_TABLET | Freq: Two times a day (BID) | ORAL | Status: AC

## 2024-05-13 MED ORDER — BUSPIRONE HCL 5 MG PO TABS: 5.0000 mg | ORAL_TABLET | Freq: Two times a day (BID) | ORAL | Status: AC

## 2024-05-13 MED ORDER — SPIRONOLACTONE 50 MG PO TABS: 50.0000 mg | ORAL_TABLET | Freq: Two times a day (BID) | ORAL | Status: AC

## 2024-05-13 NOTE — Patient Instructions (Signed)
 Heat (pad or rice pillow in microwave) over affected area, 10-15 minutes twice daily.   Ice/cold pack over area for 10-15 min twice daily.  OK to take Tylenol  1000 mg (2 extra strength tabs) or 975 mg (3 regular strength tabs) every 6 hours as needed.  If you do not hear anything about your referral in the next 1-2 weeks, call our office and ask for an update.  Let us  know if you need anything.  Pectoralis Major Rehab Ask your health care provider which exercises are safe for you. Do exercises exactly as told by your health care provider and adjust them as directed. It is normal to feel mild stretching, pulling, tightness, or discomfort as you do these exercises, but you should stop right away if you feel sudden pain or your pain gets worse. Do not begin these exercises until told by your health care provider. Stretching and range of motion exercises These exercises warm up your muscles and joints and improve the movement and flexibility of your shoulder. These exercises can also help to relieve pain, numbness, and tingling. Exercise A: Pendulum  Stand near a wall or a surface that you can hold onto for balance. Bend at the waist and let your left / right arm hang straight down. Use your other arm to keep your balance. Relax your arm and shoulder muscles, and move your hips and your trunk so your left / right arm swings freely. Your arm should swing because of the motion of your body, not because you are using your arm or shoulder muscles. Keep moving so your arm swings in the following directions, as told by your health care provider: Side to side. Forward and backward. In clockwise and counterclockwise circles. Slowly return to the starting position. Repeat 2 times. Complete this exercise 3 times per week. Exercise B: Abduction, standing Stand and hold a broomstick, a cane, or a similar object. Place your hands a little more than shoulder-width apart on the object. Your left / right hand  should be palm-up, and your other hand should be palm-down. While keeping your elbow straight and your shoulder muscles relaxed, push the stick across your body toward your left / right side. Raise your left / right arm to the side of your body and then over your head until you feel a stretch in your shoulder. Stop when you reach the angle that is recommended by your health care provider. Avoid shrugging your shoulder while you raise your arm. Keep your shoulder blade tucked down toward the middle of your spine. Hold for 10 seconds. Slowly return to the starting position. Repeat 2 times. Complete this exercise 3 times per week. Exercise C: Wand flexion, supine  Lie on your back. You may bend your knees for comfort. Hold a broomstick, a cane, or a similar object so that your hands are about shoulder-width apart on the object. Your palms should face toward your feet. Raise your left / right arm in front of your face, then behind your head (toward the floor). Use your other hand to help you do this. Stop when you feel a gentle stretch in your shoulder, or when you reach the angle that is recommended by your health care provider. Hold for 3 seconds. Use the broomstick and your other arm to help you return your left / right arm to the starting position. Repeat 2 times. Complete this exercise 3 times per week. Exercise D: Wand shoulder external rotation Stand and hold a broomstick, a cane,  or a similar object so your hands are about shoulder-width apart on the object. Start with your arms hanging down, then bend both elbows to an L shape (90 degrees). Keep your left / right elbow at your side. Use your other hand to push the stick so your left / right forearm moves away from your body, out to your side. Keep your left / right elbow bent to 90 degrees and keep it against your side. Stop when you feel a gentle stretch in your shoulder, or when you reach the angle recommended by your health care  provider. Hold for 10 seconds. Use the stick to help you return your left / right arm to the starting position. Repeat 2 times. Complete this exercise 3 times per week. Strengthening exercises These exercises build strength and endurance in your shoulder. Endurance is the ability to use your muscles for a long time, even after your muscles get tired. Exercise E: Scapular protraction, standing Stand so you are facing a wall. Place your feet about one arm-length away from the wall. Place your hands on the wall and straighten your elbows. Keep your hands on the wall as you push your upper back away from the wall. You should feel your shoulder blades sliding forward. Keep your elbows and your head still. If you are not sure that you are doing this exercise correctly, ask your health care provider for more instructions. Hold for 3 seconds. Slowly return to the starting position. Let your muscles relax completely before you repeat this exercise. Repeat 2 times. Complete this exercise 3 times per week. Exercise F: Shoulder blade squeezes  (scapular retraction) Sit with good posture in a stable chair. Do not let your back touch the back of the chair. Your arms should be at your sides with your elbows bent. You may rest your forearms on a pillow if that is more comfortable. Squeeze your shoulder blades together. Bring them down and back. Keep your shoulders level. Do not lift your shoulders up toward your ears. Hold for 3 seconds. Return to the starting position. Repeat 2 times. Complete this exercise 3 times per week. This information is not intended to replace advice given to you by your health care provider. Make sure you discuss any questions you have with your health care provider. Document Released: 04/17/2005 Document Revised: 01/27/2016 Document Reviewed: 01/03/2015 Elsevier Interactive Patient Education  [employer].

## 2024-05-13 NOTE — Progress Notes (Signed)
 Chief Complaint  Patient presents with   Follow-up    Follow Up on few Concerns    Ian Li is a 24 y.o. adult here for evaluation of central chest pain.  Duration of issue: many years Quality: sharp Palliation: intentional breathing Provocation: none No a/w meals, position, exertion.  Severity: 8/10 Radiation: none Duration of chest pain: 1 minute Associated symptoms: difficulty taking a deep breath No jaw pain or arm pain Cardiac history: none Family heart history: none Smoker? No No illicit drug use.   Patient has been dealing with worsening brain fog and difficulty concentrating.  He is seeing the psychiatry team and feels his mood is relatively stable on Geodon 40 mg daily, BuSpar  5 mg twice daily.  No homicidal or suicidal ideation.  Past Medical History:  Diagnosis Date   Depression, recurrent    GAD (generalized anxiety disorder)    Family History  Problem Relation Age of Onset   Multiple myeloma Maternal Grandmother    Diabetes Neg Hx    Heart disease Neg Hx     BP 128/80 (BP Location: Left Arm, Patient Position: Sitting)   Pulse 82   Temp 98 F (36.7 C) (Oral)   Resp 16   Ht 6' 1 (1.854 m)   Wt 215 lb (97.5 kg)   SpO2 96%   BMI 28.37 kg/m  Gen: awake, alert, appears stated age HEENT: PERRLA, MMM Neck: No masses or asymmetry Heart: RRR, no bruits, no LE edema Lungs: CTAB, no accessory muscle use Abd: Soft, NT, ND, no masses or organomegaly MSK: chest pain is not reproducible to palptation Psych: Age appropriate judgment and insight, nml mood and affect  Atypical chest pain - Plan: Ambulatory referral to Cardiology  Inattention  Suspect panic attacks vs chest wall pain.  Heat, ice, Tylenol , stretches and exercises.  Refer to cardiology for their opinion.  Does not sound like reflux.  Offered medication to take on an as needed basis for panic attacks which she politely declined. Has formal evaluation for ADHD next week. Will tx accordingly.   F/u prn. The patient voiced understanding and agreement to the plan.  I spent 30 minutes with the patient discussing the above plans in addition to reviewing his chart on the same day of the visit.  Mabel Mt [place], DO 05/13/2024 1:51 PM

## 2024-06-11 ENCOUNTER — Ambulatory Visit: Admitting: Cardiology
